# Patient Record
Sex: Female | Born: 1970 | Race: Black or African American | Hispanic: No | Smoking: Current some day smoker
Health system: Southern US, Community
[De-identification: ages and names within clinical notes are randomized; demographics above are authoritative.]

## PROBLEM LIST (undated history)

## (undated) DIAGNOSIS — E785 Hyperlipidemia, unspecified: Secondary | ICD-10-CM

## (undated) DIAGNOSIS — E119 Type 2 diabetes mellitus without complications: Secondary | ICD-10-CM

## (undated) HISTORY — PX: BREAST REDUCTION SURGERY: SHX8

## (undated) HISTORY — DX: Hyperlipidemia, unspecified: E78.5

## (undated) HISTORY — DX: Type 2 diabetes mellitus without complications: E11.9

## (undated) HISTORY — PX: OTHER SURGICAL HISTORY: SHX169

## (undated) HISTORY — PX: REDUCTION MAMMAPLASTY: SUR839

---

## 2018-09-26 ENCOUNTER — Other Ambulatory Visit: Payer: Self-pay | Admitting: Physician Assistant

## 2018-09-26 DIAGNOSIS — Z1231 Encounter for screening mammogram for malignant neoplasm of breast: Secondary | ICD-10-CM

## 2018-09-28 ENCOUNTER — Ambulatory Visit: Payer: Self-pay

## 2018-10-17 DIAGNOSIS — Z1231 Encounter for screening mammogram for malignant neoplasm of breast: Secondary | ICD-10-CM | POA: Diagnosis not present

## 2018-10-17 DIAGNOSIS — I10 Essential (primary) hypertension: Secondary | ICD-10-CM | POA: Diagnosis not present

## 2018-10-17 DIAGNOSIS — N941 Unspecified dyspareunia: Secondary | ICD-10-CM | POA: Diagnosis not present

## 2018-10-17 DIAGNOSIS — Z0000 Encounter for general adult medical examination without abnormal findings: Secondary | ICD-10-CM | POA: Diagnosis not present

## 2018-10-17 DIAGNOSIS — Z124 Encounter for screening for malignant neoplasm of cervix: Secondary | ICD-10-CM | POA: Diagnosis not present

## 2018-10-17 DIAGNOSIS — Z01419 Encounter for gynecological examination (general) (routine) without abnormal findings: Secondary | ICD-10-CM | POA: Diagnosis not present

## 2018-10-17 DIAGNOSIS — E236 Other disorders of pituitary gland: Secondary | ICD-10-CM | POA: Diagnosis not present

## 2018-10-17 DIAGNOSIS — Z113 Encounter for screening for infections with a predominantly sexual mode of transmission: Secondary | ICD-10-CM | POA: Diagnosis not present

## 2018-10-17 DIAGNOSIS — J329 Chronic sinusitis, unspecified: Secondary | ICD-10-CM | POA: Diagnosis not present

## 2018-10-17 DIAGNOSIS — Z23 Encounter for immunization: Secondary | ICD-10-CM | POA: Diagnosis not present

## 2018-10-17 DIAGNOSIS — R350 Frequency of micturition: Secondary | ICD-10-CM | POA: Diagnosis not present

## 2018-10-17 LAB — HM HIV SCREENING LAB: HM HIV Screening: NEGATIVE

## 2018-10-20 LAB — HM PAP SMEAR: HM Pap smear: NEGATIVE

## 2018-10-21 ENCOUNTER — Ambulatory Visit: Payer: Self-pay

## 2018-10-28 ENCOUNTER — Inpatient Hospital Stay: Admission: RE | Admit: 2018-10-28 | Payer: Self-pay | Source: Ambulatory Visit

## 2018-11-07 DIAGNOSIS — Z6829 Body mass index (BMI) 29.0-29.9, adult: Secondary | ICD-10-CM | POA: Diagnosis not present

## 2018-11-07 DIAGNOSIS — E119 Type 2 diabetes mellitus without complications: Secondary | ICD-10-CM | POA: Diagnosis not present

## 2018-11-14 DIAGNOSIS — Z713 Dietary counseling and surveillance: Secondary | ICD-10-CM | POA: Diagnosis not present

## 2018-11-22 DIAGNOSIS — E119 Type 2 diabetes mellitus without complications: Secondary | ICD-10-CM | POA: Diagnosis not present

## 2018-11-22 DIAGNOSIS — R52 Pain, unspecified: Secondary | ICD-10-CM | POA: Diagnosis not present

## 2018-11-22 DIAGNOSIS — J069 Acute upper respiratory infection, unspecified: Secondary | ICD-10-CM | POA: Diagnosis not present

## 2018-11-25 DIAGNOSIS — L602 Onychogryphosis: Secondary | ICD-10-CM | POA: Diagnosis not present

## 2018-11-25 DIAGNOSIS — M2041 Other hammer toe(s) (acquired), right foot: Secondary | ICD-10-CM | POA: Diagnosis not present

## 2018-11-25 DIAGNOSIS — B351 Tinea unguium: Secondary | ICD-10-CM | POA: Diagnosis not present

## 2018-11-25 DIAGNOSIS — M2042 Other hammer toe(s) (acquired), left foot: Secondary | ICD-10-CM | POA: Diagnosis not present

## 2018-12-12 DIAGNOSIS — Z713 Dietary counseling and surveillance: Secondary | ICD-10-CM | POA: Diagnosis not present

## 2019-01-27 DIAGNOSIS — K5904 Chronic idiopathic constipation: Secondary | ICD-10-CM | POA: Diagnosis not present

## 2019-01-27 DIAGNOSIS — R1013 Epigastric pain: Secondary | ICD-10-CM | POA: Diagnosis not present

## 2019-01-27 DIAGNOSIS — K8020 Calculus of gallbladder without cholecystitis without obstruction: Secondary | ICD-10-CM | POA: Diagnosis not present

## 2019-01-27 DIAGNOSIS — R140 Abdominal distension (gaseous): Secondary | ICD-10-CM | POA: Diagnosis not present

## 2019-02-10 DIAGNOSIS — E119 Type 2 diabetes mellitus without complications: Secondary | ICD-10-CM | POA: Diagnosis not present

## 2019-02-10 DIAGNOSIS — K8020 Calculus of gallbladder without cholecystitis without obstruction: Secondary | ICD-10-CM | POA: Diagnosis not present

## 2019-02-10 DIAGNOSIS — R102 Pelvic and perineal pain: Secondary | ICD-10-CM | POA: Diagnosis not present

## 2019-02-10 DIAGNOSIS — F411 Generalized anxiety disorder: Secondary | ICD-10-CM | POA: Diagnosis not present

## 2019-02-10 DIAGNOSIS — R109 Unspecified abdominal pain: Secondary | ICD-10-CM | POA: Diagnosis not present

## 2019-02-13 DIAGNOSIS — R109 Unspecified abdominal pain: Secondary | ICD-10-CM | POA: Diagnosis not present

## 2019-02-13 DIAGNOSIS — Z713 Dietary counseling and surveillance: Secondary | ICD-10-CM | POA: Diagnosis not present

## 2019-02-13 DIAGNOSIS — E119 Type 2 diabetes mellitus without complications: Secondary | ICD-10-CM | POA: Diagnosis not present

## 2019-02-13 LAB — LIPID PANEL
Cholesterol: 178 (ref 0–200)
HDL: 54 (ref 35–70)
LDL Cholesterol: 111
Triglycerides: 67 (ref 40–160)

## 2019-04-19 DIAGNOSIS — M545 Low back pain: Secondary | ICD-10-CM | POA: Diagnosis not present

## 2019-04-28 DIAGNOSIS — F411 Generalized anxiety disorder: Secondary | ICD-10-CM | POA: Diagnosis not present

## 2019-05-05 DIAGNOSIS — F411 Generalized anxiety disorder: Secondary | ICD-10-CM | POA: Diagnosis not present

## 2019-05-19 DIAGNOSIS — F411 Generalized anxiety disorder: Secondary | ICD-10-CM | POA: Diagnosis not present

## 2019-06-02 DIAGNOSIS — F411 Generalized anxiety disorder: Secondary | ICD-10-CM | POA: Diagnosis not present

## 2019-06-12 DIAGNOSIS — E119 Type 2 diabetes mellitus without complications: Secondary | ICD-10-CM | POA: Diagnosis not present

## 2019-06-12 DIAGNOSIS — K8020 Calculus of gallbladder without cholecystitis without obstruction: Secondary | ICD-10-CM | POA: Diagnosis not present

## 2019-06-12 DIAGNOSIS — Z23 Encounter for immunization: Secondary | ICD-10-CM | POA: Diagnosis not present

## 2019-06-12 DIAGNOSIS — Z6827 Body mass index (BMI) 27.0-27.9, adult: Secondary | ICD-10-CM | POA: Diagnosis not present

## 2019-06-12 DIAGNOSIS — R002 Palpitations: Secondary | ICD-10-CM | POA: Diagnosis not present

## 2019-06-12 LAB — HEMOGLOBIN A1C: Hemoglobin A1C: 7.4

## 2019-06-27 DIAGNOSIS — R634 Abnormal weight loss: Secondary | ICD-10-CM | POA: Diagnosis not present

## 2019-06-27 DIAGNOSIS — R1033 Periumbilical pain: Secondary | ICD-10-CM | POA: Diagnosis not present

## 2019-06-27 DIAGNOSIS — K5900 Constipation, unspecified: Secondary | ICD-10-CM | POA: Diagnosis not present

## 2019-06-27 DIAGNOSIS — R1010 Upper abdominal pain, unspecified: Secondary | ICD-10-CM | POA: Diagnosis not present

## 2019-06-27 LAB — BASIC METABOLIC PANEL
BUN: 11 (ref 4–21)
Creatinine: 0.7 (ref 0.5–1.1)
Glucose: 163
Potassium: 4.1 (ref 3.4–5.3)
Sodium: 142 (ref 137–147)

## 2019-07-07 DIAGNOSIS — R1033 Periumbilical pain: Secondary | ICD-10-CM | POA: Diagnosis not present

## 2019-07-07 DIAGNOSIS — R109 Unspecified abdominal pain: Secondary | ICD-10-CM | POA: Diagnosis not present

## 2019-07-07 DIAGNOSIS — K5730 Diverticulosis of large intestine without perforation or abscess without bleeding: Secondary | ICD-10-CM | POA: Diagnosis not present

## 2019-07-07 DIAGNOSIS — R634 Abnormal weight loss: Secondary | ICD-10-CM | POA: Diagnosis not present

## 2019-07-07 DIAGNOSIS — R1010 Upper abdominal pain, unspecified: Secondary | ICD-10-CM | POA: Diagnosis not present

## 2019-07-11 DIAGNOSIS — K3189 Other diseases of stomach and duodenum: Secondary | ICD-10-CM | POA: Diagnosis not present

## 2019-07-11 DIAGNOSIS — R634 Abnormal weight loss: Secondary | ICD-10-CM | POA: Diagnosis not present

## 2019-07-11 DIAGNOSIS — R1013 Epigastric pain: Secondary | ICD-10-CM | POA: Diagnosis not present

## 2019-07-11 DIAGNOSIS — R933 Abnormal findings on diagnostic imaging of other parts of digestive tract: Secondary | ICD-10-CM | POA: Diagnosis not present

## 2019-07-19 DIAGNOSIS — K8020 Calculus of gallbladder without cholecystitis without obstruction: Secondary | ICD-10-CM | POA: Diagnosis not present

## 2019-07-19 DIAGNOSIS — K760 Fatty (change of) liver, not elsewhere classified: Secondary | ICD-10-CM | POA: Diagnosis not present

## 2019-07-19 DIAGNOSIS — R1013 Epigastric pain: Secondary | ICD-10-CM | POA: Diagnosis not present

## 2019-07-25 DIAGNOSIS — Z20828 Contact with and (suspected) exposure to other viral communicable diseases: Secondary | ICD-10-CM | POA: Diagnosis not present

## 2019-08-10 DIAGNOSIS — R1013 Epigastric pain: Secondary | ICD-10-CM | POA: Diagnosis not present

## 2019-08-30 DIAGNOSIS — R1013 Epigastric pain: Secondary | ICD-10-CM | POA: Diagnosis not present

## 2019-09-05 ENCOUNTER — Other Ambulatory Visit: Payer: Self-pay

## 2019-09-05 ENCOUNTER — Encounter: Payer: BC Managed Care – PPO | Attending: Physician Assistant | Admitting: *Deleted

## 2019-09-05 DIAGNOSIS — E119 Type 2 diabetes mellitus without complications: Secondary | ICD-10-CM | POA: Insufficient documentation

## 2019-09-05 NOTE — Patient Instructions (Signed)
Plan:  Aim for 3 Carb Choices per meal (45 grams) +/- 1 either way  Aim for 0-1 Carbs per snack if hungry  Include protein in moderation with your meals and snacks Consider reading food labels for Total Carbohydrate of foods Consider  increasing your activity level by dancing, walking or chair exercises for 15-30 minutes daily as tolerated Continue checking BG at alternate times per day  Continue taking medication as directed by MD

## 2019-09-06 NOTE — Progress Notes (Signed)
Diabetes Self-Management Education  Visit Type: First/Initial  Appt. Start Time: 1530 Appt. End Time: 1700  09/06/2019  [name], identified by name and date of birth, is a 48 y.o. female with a diagnosis of Diabetes: Type 2. Patient is newly diagnosed in February this year. She works as a Geophysicist/field seismologist full time. She is concerned about changes in her vision and is interested in learning more about nutrition guidelines for diabetes control today. She is already testing her blood sugars daily and is walking her dog when the weather permits.   ASSESSMENT  There were no vitals taken for this visit. There is no height or weight on file to calculate BMI.  Diabetes Self-Management Education - 09/05/19 1553      Visit Information   Visit Type  First/Initial      Initial Visit   Diabetes Type  Type 2    Are you currently following a meal plan?  No    Are you taking your medications as prescribed?  Yes    Date Diagnosed  10/2018      Health Coping   How would you rate your overall health?  Good      Psychosocial Assessment   Patient Belief/Attitude about Diabetes  Afraid   afraid of all the chances for complications   Self-care barriers  None    Other persons present  Patient    Patient Concerns  Nutrition/Meal planning;Glycemic Control    Special Needs  None    Preferred Learning Style  Auditory;Visual;Hands on    [place]ira in progress    How often do you need to have someone help you when you read instructions, pamphlets, or other written materials from your doctor or pharmacy?  1 - Never    What is the last grade level you completed in school?  some college      Pre-Education Assessment   Patient understands the diabetes disease and treatment process.  Needs Instruction    Patient understands incorporating nutritional management into lifestyle.  Needs Instruction    Patient undertands incorporating physical activity into lifestyle.  Needs Instruction    Patient understands using medications safely.  Needs Instruction    Patient understands monitoring blood glucose, interpreting and using results  Needs Instruction    Patient understands prevention, detection, and treatment of acute complications.  Needs Instruction    Patient understands prevention, detection, and treatment of chronic complications.  Needs Instruction    Patient understands how to develop strategies to address psychosocial issues.  Needs Instruction    Patient understands how to develop strategies to promote health/change behavior.  Needs Instruction      Complications   Last HgB A1C per patient/outside source  7.4 %    How often do you check your blood sugar?  1-2 times/day    Fasting Blood glucose range (mg/dL)  130-179;70-129    Number of hypoglycemic episodes per month  0    Have you had a dilated eye exam in the past 12 months?  Yes    Have you had a dental exam in the past 12 months?  Yes    Are you checking your feet?  Yes    How many days per week are you checking your feet?  7      Dietary Intake   Breakfast  oatmeal witn cinnamon, butter, cream at home and 2 sausage patties OR McMuffin with sausage OR cajun filet without the biscuit with fries or rounds  Snack (morning)  fruit cup OR granola bar OR fresh fruit with PNB, Belvita crackers with PNB    Lunch  @ 2PM: salmon and collard greens OR lean meat sub 6" occasionally with chips or fries or soup    Snack (afternoon)  nuts OR Popcorn  OR  similar to AM snacks    Dinner  ligher than lunch: salad OR steamed vegetables with meat    Snack (evening)  typically fresh fruit OR triscuits with cheese and olives    Beverage(s)  decaf, water, occasionally 1/2 and 1/2 tea, wine maybe 2-3 nights a week      Exercise   Exercise Type  Light (walking / raking leaves)    How many days per week to you exercise?  1    How many minutes per day do you exercise?  30    Total minutes per week of exercise  30      Patient  Education   Previous Diabetes Education  No    Disease state   Definition of diabetes, type 1 and 2, and the diagnosis of diabetes;Factors that contribute to the development of diabetes    Nutrition management   Role of diet in the treatment of diabetes and the relationship between the three main macronutrients and blood glucose level;Food label reading, portion sizes and measuring food.;Carbohydrate counting;Reviewed blood glucose goals for pre and post meals and how to evaluate the patients' food intake on their blood glucose level.    Physical activity and exercise   Role of exercise on diabetes management, blood pressure control and cardiac health.;Helped patient identify appropriate exercises in relation to his/her diabetes, diabetes complications and other health issue.    Medications  Reviewed patients medication for diabetes, action, purpose, timing of dose and side effects.    Monitoring  Identified appropriate SMBG and/or A1C goals.;Purpose and frequency of SMBG.    Chronic complications  Relationship between chronic complications and blood glucose control    Psychosocial adjustment  Role of stress on diabetes      Individualized Goals (developed by patient)   Nutrition  Follow meal plan discussed    Physical Activity  Exercise 3-5 times per week    Medications  take my medication as prescribed    Monitoring   test blood glucose pre and post meals as discussed      Post-Education Assessment   Patient understands the diabetes disease and treatment process.  Demonstrates understanding / competency    Patient understands incorporating nutritional management into lifestyle.  Demonstrates understanding / competency    Patient undertands incorporating physical activity into lifestyle.  Demonstrates understanding / competency    Patient understands using medications safely.  Demonstrates understanding / competency    Patient understands monitoring blood glucose, interpreting and using results   Demonstrates understanding / competency    Patient understands prevention, detection, and treatment of acute complications.  Demonstrates understanding / competency    Patient understands prevention, detection, and treatment of chronic complications.  Demonstrates understanding / competency    Patient understands how to develop strategies to address psychosocial issues.  Demonstrates understanding / competency    Patient understands how to develop strategies to promote health/change behavior.  Demonstrates understanding / competency      Outcomes   Expected Outcomes  Demonstrated interest in learning. Expect positive outcomes    Future DMSE  4-6 wks    Program Status  Not Completed       Individualized Plan for Diabetes Self-Management Training:  Learning Objective:  Patient will have a greater understanding of diabetes self-management. Patient education plan is to attend individual and/or group sessions per assessed needs and concerns.   Plan:   Patient Instructions  Plan:  Aim for 3 Carb Choices per meal (45 grams) +/- 1 either way  Aim for 0-1 Carbs per snack if hungry  Include protein in moderation with your meals and snacks Consider reading food labels for Total Carbohydrate of foods Consider  increasing your activity level by dancing, walking or chair exercises for 15-30 minutes daily as tolerated Continue checking BG at alternate times per day  Continue taking medication as directed by MD  Expected Outcomes:  Demonstrated interest in learning. Expect positive outcomes  Education material provided: Food label handouts, A1C conversion sheet, Meal plan card and Carbohydrate counting sheet  If problems or questions, patient to contact team via:  Phone  Future DSME appointment: 4-6 wks

## 2019-09-11 DIAGNOSIS — K8050 Calculus of bile duct without cholangitis or cholecystitis without obstruction: Secondary | ICD-10-CM | POA: Diagnosis not present

## 2019-10-02 ENCOUNTER — Ambulatory Visit: Payer: BC Managed Care – PPO | Admitting: Dietician

## 2019-10-05 ENCOUNTER — Other Ambulatory Visit: Payer: Self-pay

## 2019-10-05 ENCOUNTER — Encounter: Payer: Self-pay | Admitting: Family Medicine

## 2019-10-05 ENCOUNTER — Ambulatory Visit: Payer: BC Managed Care – PPO | Admitting: Family Medicine

## 2019-10-05 VITALS — BP 122/89 | HR 71 | Temp 97.7°F | Ht 66.25 in | Wt 168.0 lb

## 2019-10-05 DIAGNOSIS — E119 Type 2 diabetes mellitus without complications: Secondary | ICD-10-CM

## 2019-10-05 DIAGNOSIS — R413 Other amnesia: Secondary | ICD-10-CM

## 2019-10-05 DIAGNOSIS — Z20822 Contact with and (suspected) exposure to covid-19: Secondary | ICD-10-CM | POA: Diagnosis not present

## 2019-10-05 NOTE — Progress Notes (Signed)
Subjective:     Brianna Stephens is a 49 y.o. 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 Packs/day: 0.25  . Years: 22.00  . Pack years: 5.50  . Types: Cigarettes  Smokeless Tobacco Never Used  Tobacco Comment   not daily        Objective:    BP Readings from Last 3 Encounters:  10/05/19 122/89   Wt Readings from Last 3 Encounters:  10/05/19 168 lb (76.2 kg)    BP 122/89   Pulse 71   Temp 97.7 F (36.5 C)   Ht 5' 6.25" (1.683 m)   Wt 168 lb (76.2 kg)   SpO2 98%   BMI 26.91 kg/m    Physical Exam Constitutional:      General: She is not in acute distress.    Appearance: She is well-developed. She is not diaphoretic.  HENT:     Right Ear: External ear normal.     Left Ear: External ear normal.     Nose: Nose normal.  Eyes:     Conjunctiva/sclera: Conjunctivae normal.  Cardiovascular:     Rate and Rhythm: Normal rate.  Pulmonary:     Effort: Pulmonary effort is normal.  Musculoskeletal:     Cervical back: Neck supple.  Skin:    General: Skin is warm and dry.     Capillary Refill: Capillary refill takes less than 2 seconds.  Neurological:     Mental Status: She is alert. Mental status is at baseline.  Psychiatric:      Mood and Affect: Mood normal.        Behavior: Behavior normal.           Assessment & Plan:   Problem List Items Addressed This Visit      Endocrine   Type 2 diabetes mellitus without complication, without long-term current use of insulin ([place])    Working on diet and seeing a nutritionist. Last check was trending up so will repeat today. Cont metformin.       Relevant Medications   atorvastatin (LIPITOR) 20 MG tablet   Other Relevant Orders   Hemoglobin A1c     Other   Poor short term memory - Primary    Normal memory evaluation today. Will get labs to assess. Suspect anxiety vs ADHD given that it seems attention and organization are most greatly impacted. Psychology referral for counseling as well as assessment to aid in diagnosis if ADHD.       Relevant Orders   Comprehensive metabolic panel   Vitamin J88   TSH   Ambulatory referral to Psychology   Exposure to COVID-19 virus    Discussed results of antibody test  and that if positive we do not know how long immunity will last. Pt understands and plans to still get the vaccine.       Relevant Orders   SARS-CoV-2 Antibodies       Return if symptoms worsen or fail to improve.  Lynnda Child, MD

## 2019-10-05 NOTE — Assessment & Plan Note (Signed)
Normal memory evaluation today. Will get labs to assess. Suspect anxiety vs ADHD given that it seems attention and organization are most greatly impacted. Psychology referral for counseling as well as assessment to aid in diagnosis if ADHD.

## 2019-10-05 NOTE — Patient Instructions (Addendum)
[email or web address]  [email or web address]   #Memory issues - psychology referral for evaluation - labs today

## 2019-10-05 NOTE — Assessment & Plan Note (Signed)
Discussed results of antibody test and that if positive we do not know how long immunity will last. Pt understands and plans to still get the vaccine.

## 2019-10-05 NOTE — Assessment & Plan Note (Addendum)
Working on diet and seeing a nutritionist. Last check was trending up so will repeat today. Cont metformin.

## 2019-10-06 ENCOUNTER — Ambulatory Visit: Payer: BC Managed Care – PPO | Admitting: Dietician

## 2019-10-06 LAB — COMPREHENSIVE METABOLIC PANEL
ALT: 14 U/L (ref 0–35)
AST: 13 U/L (ref 0–37)
Albumin: 4.7 g/dL (ref 3.5–5.2)
Alkaline Phosphatase: 51 U/L (ref 39–117)
BUN: 11 mg/dL (ref 6–23)
CO2: 30 mEq/L (ref 19–32)
Calcium: 9.6 mg/dL (ref 8.4–10.5)
Chloride: 103 mEq/L (ref 96–112)
Creatinine, Ser: 0.63 mg/dL (ref 0.40–1.20)
GFR: 100.35 mL/min (ref >60.00)
Glucose, Bld: 146 mg/dL — ABNORMAL HIGH (ref 70–99)
Potassium: 3.9 mEq/L (ref 3.5–5.1)
Sodium: 139 mEq/L (ref 135–145)
Total Bilirubin: 0.4 mg/dL (ref 0.2–1.2)
Total Protein: 7.8 g/dL (ref 6.0–8.3)

## 2019-10-06 LAB — SARS-COV-2 ANTIBODIES: SARS-CoV-2 Antibodies: NEGATIVE

## 2019-10-06 LAB — VITAMIN B12: Vitamin B-12: 282 pg/mL (ref 211–911)

## 2019-10-06 LAB — HEMOGLOBIN A1C: Hgb A1c MFr Bld: 7.1 % — ABNORMAL HIGH (ref 4.6–6.5)

## 2019-10-06 LAB — TSH: TSH: 0.71 u[IU]/mL (ref 0.35–4.50)

## 2019-10-24 ENCOUNTER — Ambulatory Visit: Payer: BC Managed Care – PPO | Admitting: Family Medicine

## 2019-10-24 DIAGNOSIS — Z01419 Encounter for gynecological examination (general) (routine) without abnormal findings: Secondary | ICD-10-CM | POA: Diagnosis not present

## 2019-10-24 DIAGNOSIS — H2513 Age-related nuclear cataract, bilateral: Secondary | ICD-10-CM | POA: Diagnosis not present

## 2019-10-24 DIAGNOSIS — K8020 Calculus of gallbladder without cholecystitis without obstruction: Secondary | ICD-10-CM | POA: Diagnosis not present

## 2019-10-24 DIAGNOSIS — Z0000 Encounter for general adult medical examination without abnormal findings: Secondary | ICD-10-CM | POA: Diagnosis not present

## 2019-10-24 DIAGNOSIS — Z1231 Encounter for screening mammogram for malignant neoplasm of breast: Secondary | ICD-10-CM | POA: Diagnosis not present

## 2019-10-24 DIAGNOSIS — Z124 Encounter for screening for malignant neoplasm of cervix: Secondary | ICD-10-CM | POA: Diagnosis not present

## 2019-10-24 DIAGNOSIS — E119 Type 2 diabetes mellitus without complications: Secondary | ICD-10-CM | POA: Diagnosis not present

## 2019-10-24 DIAGNOSIS — N760 Acute vaginitis: Secondary | ICD-10-CM | POA: Diagnosis not present

## 2019-10-24 DIAGNOSIS — B9689 Other specified bacterial agents as the cause of diseases classified elsewhere: Secondary | ICD-10-CM | POA: Diagnosis not present

## 2019-10-24 DIAGNOSIS — E236 Other disorders of pituitary gland: Secondary | ICD-10-CM | POA: Diagnosis not present

## 2019-10-27 ENCOUNTER — Ambulatory Visit: Payer: Self-pay | Admitting: Dietician

## 2019-11-13 DIAGNOSIS — L659 Nonscarring hair loss, unspecified: Secondary | ICD-10-CM | POA: Diagnosis not present

## 2019-11-20 DIAGNOSIS — F900 Attention-deficit hyperactivity disorder, predominantly inattentive type: Secondary | ICD-10-CM | POA: Diagnosis not present

## 2019-11-20 DIAGNOSIS — F411 Generalized anxiety disorder: Secondary | ICD-10-CM | POA: Diagnosis not present

## 2019-11-27 DIAGNOSIS — R430 Anosmia: Secondary | ICD-10-CM | POA: Diagnosis not present

## 2019-11-27 DIAGNOSIS — Z20828 Contact with and (suspected) exposure to other viral communicable diseases: Secondary | ICD-10-CM | POA: Diagnosis not present

## 2019-11-27 DIAGNOSIS — R197 Diarrhea, unspecified: Secondary | ICD-10-CM | POA: Diagnosis not present

## 2019-11-27 DIAGNOSIS — R432 Parageusia: Secondary | ICD-10-CM | POA: Diagnosis not present

## 2019-11-27 DIAGNOSIS — R5383 Other fatigue: Secondary | ICD-10-CM | POA: Diagnosis not present

## 2019-12-11 DIAGNOSIS — F900 Attention-deficit hyperactivity disorder, predominantly inattentive type: Secondary | ICD-10-CM | POA: Diagnosis not present

## 2019-12-11 DIAGNOSIS — F411 Generalized anxiety disorder: Secondary | ICD-10-CM | POA: Diagnosis not present

## 2020-02-04 ENCOUNTER — Emergency Department (HOSPITAL_COMMUNITY): Payer: BC Managed Care – PPO

## 2020-02-04 ENCOUNTER — Emergency Department (HOSPITAL_COMMUNITY)
Admission: EM | Admit: 2020-02-04 | Discharge: 2020-02-04 | Disposition: A | Discharge status: Home / Self Care | Payer: BC Managed Care – PPO | Attending: Emergency Medicine | Admitting: Emergency Medicine

## 2020-02-04 ENCOUNTER — Encounter (HOSPITAL_COMMUNITY): Payer: Self-pay | Admitting: Emergency Medicine

## 2020-02-04 ENCOUNTER — Other Ambulatory Visit: Payer: Self-pay

## 2020-02-04 DIAGNOSIS — Z7984 Long term (current) use of oral hypoglycemic drugs: Secondary | ICD-10-CM | POA: Diagnosis not present

## 2020-02-04 DIAGNOSIS — Z79899 Other long term (current) drug therapy: Secondary | ICD-10-CM | POA: Insufficient documentation

## 2020-02-04 DIAGNOSIS — R079 Chest pain, unspecified: Secondary | ICD-10-CM | POA: Diagnosis not present

## 2020-02-04 DIAGNOSIS — E119 Type 2 diabetes mellitus without complications: Secondary | ICD-10-CM | POA: Diagnosis not present

## 2020-02-04 DIAGNOSIS — R1010 Upper abdominal pain, unspecified: Secondary | ICD-10-CM | POA: Insufficient documentation

## 2020-02-04 DIAGNOSIS — R0789 Other chest pain: Secondary | ICD-10-CM | POA: Diagnosis not present

## 2020-02-04 DIAGNOSIS — F17210 Nicotine dependence, cigarettes, uncomplicated: Secondary | ICD-10-CM | POA: Diagnosis not present

## 2020-02-04 LAB — BASIC METABOLIC PANEL
Anion gap: 11 (ref 5–15)
BUN: 10 mg/dL (ref 6–20)
CO2: 27 mmol/L (ref 22–32)
Calcium: 9.8 mg/dL (ref 8.9–10.3)
Chloride: 103 mmol/L (ref 98–111)
Creatinine, Ser: 0.6 mg/dL (ref 0.44–1.00)
GFR calc Af Amer: >60 mL/min (ref >60)
GFR calc non Af Amer: >60 mL/min (ref >60)
Glucose, Bld: 235 mg/dL — ABNORMAL HIGH (ref 70–99)
Potassium: 3.9 mmol/L (ref 3.5–5.1)
Sodium: 141 mmol/L (ref 135–145)

## 2020-02-04 LAB — CBC
HCT: 38.4 % (ref 36.0–46.0)
Hemoglobin: 12.8 g/dL (ref 12.0–15.0)
MCH: 26.7 pg (ref 26.0–34.0)
MCHC: 33.3 g/dL (ref 30.0–36.0)
MCV: 80.2 fL (ref 80.0–100.0)
Platelets: 155 10*3/uL (ref 150–400)
RBC: 4.79 MIL/uL (ref 3.87–5.11)
RDW: 14.4 % (ref 11.5–15.5)
WBC: 4.3 10*3/uL (ref 4.0–10.5)
nRBC: 0 % (ref 0.0–0.2)

## 2020-02-04 LAB — TROPONIN I (HIGH SENSITIVITY): Troponin I (High Sensitivity): <2 ng/L (ref <18)

## 2020-02-04 LAB — POC URINE PREG, ED: Preg Test, Ur: NEGATIVE

## 2020-02-04 LAB — I-STAT BETA HCG BLOOD, ED (MC, WL, AP ONLY): I-stat hCG, quantitative: 20.8 m[IU]/mL — ABNORMAL HIGH (ref <5)

## 2020-02-04 MED ORDER — SODIUM CHLORIDE 0.9% FLUSH: 3.0000 mL | Freq: Once | INTRAVENOUS | Status: DC

## 2020-02-04 NOTE — Discharge Instructions (Addendum)
You were evaluated in the emergency department for chest pain.    Based on your risk factors, work up and exam you are considered low risk for major adverse cardiac events in the next 30 days.  This means you can be discharged with close follow up with follow up with primary care doctor further outpatient work up as needed, if your chest discomfort continues you doctor may need to refer you to cardiology  Call your primary care doctor further discussion and work up of your symptoms on an outpatient setting  Please return to ED if: Your chest pain is worse or on exertion You have a cough that gets worse, or you cough up blood. You have severe pain in chest, back or abdomen. You have chest pain with loss of sensation, weakness or tingling in extremities You have chest pain or shortness of breath with exertion or activity You have sudden, unexplained discomfort in your chest, with radiation arms, back, neck, or jaw. You suddenly have chest pain and begin to sweat, or your skin gets clammy. You feel chest pain with nausea or vomiting, blood in vomit You suddenly feel light-headed or faint. Your heart begins to beat quickly, or it feels like it is skipping beats. You have one sided leg swelling or calf pain You have chest pain with fever, chills, cough or other viral symptoms  The cause of your chest pain is unclear, it may be related to gallstones.  It may be related to gastritis or muscular cause.  Take omeprazole 40 mg on an empty stomach every morning.

## 2020-02-04 NOTE — ED Triage Notes (Signed)
C/o intermittent aching to L chest x 2-3 months.  Reports intermittent L hand tingling and diaphoresis at night x 2 weeks.  Denies SOB.  Reports nausea but thinks it may be related to gallbladder issues she has.

## 2020-02-04 NOTE — ED Provider Notes (Addendum)
[facility] EMERGENCY DEPARTMENT Provider Note   CSN: [id number] Arrival date & time: 02/04/20  1259     History Chief Complaint  Patient presents with  . Chest Pain    Brianna Stephens is a 49 y.o. female with history of diabetes on metformin, HLD, gallstones presents to ER for evaluation of chest pain.  Reports chest pain has been going on for 2-3 months.  Has been told it was from gallstones, has been seen by general surgery who recommended elective cholecystectomy.  States her usual gall bladder pain causes chest and upper abdominal discomfort but she only has chest discomfort now and no abdominal pain. The pain is located under the left breast.  It feels like an ache.  No changes with exertion, position, movement or eating or breathing.  States the pain feels the same as usual but now she has new symptoms for the last one week.  Reports associated clamminess, sweats at night.  Today at work she felt like her brain was foggy like all of a sudden she didn't know what she was doing.  Also states she feels like she has been tripping more than usual. Also noticed intermittent left hand achiness and tingling.  She just started taking collagen supplements 2 days ago  Because she says she wants to intake more protein for her muscles.  Completed COVID vaccine in April. Denies recent post prandrial chest or abdominal pain, gas, burping, regurgitation.  Denies fever, nausea, vomiting. Denies cough, SOB.  No recent lifting or exercise to cause muscular pain.   HPI     Past Medical History:  Diagnosis Date  . Diabetes mellitus without complication (HCC)   . Hyperlipidemia     Patient Active Problem List   Diagnosis Date Noted  . Poor short term memory 10/05/2019  . Type 2 diabetes mellitus without complication, without long-term current use of insulin ([facility]) 10/05/2019  . Exposure to COVID-19 virus 10/05/2019    Past Surgical History:  Procedure Laterality Date  . bone  spur surgery     both pinky toes  . BREAST REDUCTION SURGERY Bilateral   . CESAREAN SECTION     x 2     OB History   No obstetric history on file.     Family History  Problem Relation Age of Onset  . Diabetes Mother   . Hypertension Mother   . Hypertension Father   . Asthma Sister   . Diabetes Paternal Grandmother   . Atrial fibrillation Paternal Grandmother     Social History   Tobacco Use  . Smoking status: Current Some Day Smoker    Packs/day: 0.25    Years: 22.00    Pack years: 5.50    Types: Cigarettes  . Smokeless tobacco: Never Used  . Tobacco comment: not daily  Substance Use Topics  . Alcohol use: Yes    Comment: wine- 2 days a week  . Drug use: Never    Home Medications Prior to Admission medications   Medication Sig Start Date End Date Taking? Authorizing Provider  Accu-Chek FastClix Lancets MISC USE BID AS DIRECTED 11/07/18   [provider]  ALPRAZolam Prudy Feeler) 0.5 MG tablet Take 1 tablet by mouth 2 (two) times daily as needed. 10/19/18   [provider]  atorvastatin (LIPITOR) 20 MG tablet Take 20 mg by mouth daily. 09/03/19   [provider]  Blood Glucose Monitoring Suppl (GLUCOCOM BLOOD GLUCOSE MONITOR) DEVI by Does not apply route. 11/07/18 11/07/20  [provider]  fluticasone (FLONASE) 50 MCG/ACT nasal spray 1 spray by Each Nare route daily. 06/12/19 06/11/20  [provider]  glucose blood (ACCU-CHEK GUIDE) test strip TEST TWICE DAILY AS DIRECTED 06/19/19   [provider]  LINZESS 145 MCG CAPS capsule Take 145 mcg by mouth daily as needed. 06/05/19   [provider]  metFORMIN (GLUCOPHAGE) 500 MG tablet Take by mouth 2 (two) times daily with a meal.    [provider]  Multiple Vitamin (MULTIVITAMIN) tablet Take by mouth.    [provider]    Allergies    Patient has no known allergies.  Review of Systems   Review of Systems  Constitutional:       Night sweats     Cardiovascular: Positive for chest pain.  Neurological:       Hand paresthesias   All other systems reviewed and are negative.   Physical Exam Updated Vital Signs BP (!) 143/99 (BP Location: Left Arm)   Pulse 73   Temp 98.1 F (36.7 C) (Oral)   Resp 16   SpO2 100%   Physical Exam Constitutional:      Appearance: She is well-developed.     Comments: NAD. Non toxic.   HENT:     Head: Normocephalic and atraumatic.     Nose: Nose normal.  Eyes:     General: Lids are normal.     Conjunctiva/sclera: Conjunctivae normal.  Neck:     Trachea: Trachea normal.     Comments: Trachea midline.  Cardiovascular:     Rate and Rhythm: Normal rate and regular rhythm.     Pulses:          Radial pulses are 1+ on the right side and 1+ on the left side.       Dorsalis pedis pulses are 1+ on the right side and 1+ on the left side.     Heart sounds: Normal heart sounds, S1 normal and S2 normal.     Comments: No murmurs. No LE edema or calf tenderness.  Pulmonary:     Effort: Pulmonary effort is normal.     Breath sounds: Normal breath sounds.  Chest:     Comments: No reproducible chest wall tenderness. No reproducible pain with active movement of LUE Abdominal:     General: Bowel sounds are normal.     Palpations: Abdomen is soft.     Tenderness: There is no abdominal tenderness.     Comments: No epigastric or upper abdominal tenderness.  Musculoskeletal:     Cervical back: Normal range of motion.  Skin:    General: Skin is warm and dry.     Capillary Refill: Capillary refill takes less than 2 seconds.     Comments: No rash to chest wall  Neurological:     Mental Status: She is alert.     GCS: GCS eye subscore is 4. GCS verbal subscore is 5. GCS motor subscore is 6.     Comments: Sensation and strength intact in upper/lower extremities  Psychiatric:        Speech: Speech normal.        Behavior: Behavior normal.        Thought Content: Thought content normal.     ED Results /  Procedures / Treatments   Labs (all labs ordered are listed, but only abnormal results are displayed) Labs Reviewed  BASIC METABOLIC PANEL - Abnormal; Notable for the following components:      Result Value  Glucose, Bld 235 (*)    All other components within normal limits  I-STAT BETA HCG BLOOD, ED ([facility], [facility], [facility] ONLY) - Abnormal; Notable for the following components:   I-stat hCG, quantitative 20.8 (*)    All other components within normal limits  CBC  POC URINE PREG, ED  TROPONIN I (HIGH SENSITIVITY)    EKG EKG Interpretation  Date/Time:  Sunday Feb 04 2020 13:00:20 EDT Ventricular Rate:  74 PR Interval:  186 QRS Duration: 78 QT Interval:  384 QTC Calculation: 426 R Axis:   48 Text Interpretation: Normal sinus rhythm Low voltage QRS Nonspecific T wave abnormality Confirmed by Cathren Laine ([id number]) on 02/04/2020 2:14:57 PM   Radiology DG Chest 2 View  Result Date: 02/04/2020 CLINICAL DATA:  Left chest pain for 2-3 months. Night sweats for 2 weeks. Nausea. EXAM: CHEST - 2 VIEW COMPARISON:  None. FINDINGS: The heart size and mediastinal contours are within normal limits. Both lungs are clear. The visualized skeletal structures are unremarkable. IMPRESSION: No active cardiopulmonary disease. Electronically Signed   By: Danae Orleans M.D.   On: 02/04/2020 13:31    Procedures Procedures (including critical care time)  Medications Ordered in ED Medications  sodium chloride flush (NS) 0.9 % injection 3 mL (0 mLs Intravenous Hold 02/04/20 1504)    ED Course  I have reviewed the triage vital signs and the nursing notes.  Pertinent labs & imaging results that were available during my care of the patient were reviewed by me and considered in my medical decision making (see chart for details).  Clinical Course as of Feb 04 1536  Sun Feb 04, 2020  1521 Preg Test, Ur: NEGATIVE [CG]  1521 I-stat hCG, quantitative(!): 20.8 [CG]  1521 Troponin I (High Sensitivity): <2 [CG]      Clinical Course User Index [CG] Liberty Handy, PA-C   MDM Rules/Calculators/A&P                      Pt is a 49 y.o. female presents with what sounds like atypical CP. Onset 2-3 months ago.  Told in the past it was related to gallstones, planning on elective cholecystectomy.  CP has been constant since arrival.  CP is non exertional, non pleuritic, non positional. No associated concerning features such as fever, cough, SOB. No recent illnesses. No palpitations, light-headedness, syncope, pleuritic pain, leg swelling/calf pain to suggest DVT.  Cardiac risk factors include HLD, DM, tobacco use only.  Traveled to [place] recently but flight was only 3 hours.   VS WNL and stable. CV and pulmonary exam benign. There is no reproducible CP or abdominal pain with palpation and position changes.  No LE edema or calf tenderness. No neuro or pulse deficits.   ER work up initiated in triage including CBC, BMP, EKG, CXR and trop x 1.  These were personally reviewed and interpreted.  Work up benign. EKG non-ischemic.  Hs-trop undetectable.  CP has been constant and no need for repeat troponin. No risk factors for PE/DVT.  PERC negative. HEAR score < 3. No LFT or lipase but patient has no abdominal pain, doubt GI surgical cause.  Given symptomatology, exam, non ischemic cardiac work up in ER and HEART score patient is appropriate for discharge with PCP f/u.  Work up not suggestive of symptomatic anemia, PE, PTX, dissection, ACS.  Likely atypical chest pain, possibly MSK etiology vs pleurisy vs costochondritis vs GI related vs other. Recommended tylenol and PPI every morning. ED return  preacutions given. Pt appears reliable for follow up, aware of symptoms that would warrant return to ER.  Pt is comfortable and agreeable with ER POC and discharge plan.   Final Clinical Impression(s) / ED Diagnoses Final diagnoses:  Atypical chest pain    Rx / DC Orders ED Discharge Orders    None         Kinnie Feil, PA-C 02/04/20 1538    Lajean Saver, MD 02/04/20 [phone]

## 2020-02-19 DIAGNOSIS — F411 Generalized anxiety disorder: Secondary | ICD-10-CM | POA: Diagnosis not present

## 2020-02-19 DIAGNOSIS — L659 Nonscarring hair loss, unspecified: Secondary | ICD-10-CM | POA: Diagnosis not present

## 2020-02-19 DIAGNOSIS — R413 Other amnesia: Secondary | ICD-10-CM | POA: Diagnosis not present

## 2020-02-19 DIAGNOSIS — E119 Type 2 diabetes mellitus without complications: Secondary | ICD-10-CM | POA: Diagnosis not present

## 2020-02-27 ENCOUNTER — Other Ambulatory Visit: Payer: Self-pay

## 2020-02-27 ENCOUNTER — Ambulatory Visit: Payer: BC Managed Care – PPO | Admitting: Family Medicine

## 2020-02-27 ENCOUNTER — Encounter: Payer: Self-pay | Admitting: Family Medicine

## 2020-02-27 VITALS — BP 118/80 | HR 76 | Temp 96.6°F | Ht 66.25 in | Wt 160.0 lb

## 2020-02-27 DIAGNOSIS — L659 Nonscarring hair loss, unspecified: Secondary | ICD-10-CM | POA: Diagnosis not present

## 2020-02-27 DIAGNOSIS — E119 Type 2 diabetes mellitus without complications: Secondary | ICD-10-CM | POA: Diagnosis not present

## 2020-02-27 NOTE — Patient Instructions (Addendum)
#  Referral I have placed a referral to a specialist for you. You should receive a phone call from the specialty office. Make sure your voicemail is not full and that if you are able to answer your phone to unknown or new numbers.   It may take up to 2 weeks to hear about the referral. If you do not hear anything in 2 weeks, please call our office and ask to speak with the referral coordinator.     Return in 3 months for diabetes   Increase metformin Week 1: Take 2 tablets in the morning and 1 tablet in the evening Week 2: Take 2 tablets twice daily

## 2020-02-27 NOTE — Progress Notes (Signed)
Subjective:     Brianna Stephens is a 49 y.o. female presenting for Diabetes (concerns with medications and changes)     HPI  #Diabetes - has been busy - but did go on vacation - not sure what she did different to go from 7.2>9 % on hgba1c - has lost weight - planning to reschedule an appointment to see the dietician - has noticed some hairloss - has been strenuously working out  - does not want to be on all the medication   Wondering about endocrinology referral Diabetes education Medications ozempic  Concerns about weight and hairloss  - normal blood work in 10/2019 - had a normal endoscopy and colonscopy with GI specialist - Dr. [place] [facility] Bynum Bellows - normal pap - normal mammogram  Will take her cholesterol medication 1-2 times a week   Chart Review  02/19/2020: Clinic: previous PCP - started ozempic, Hgb A1c 9.0%   Review of Systems   Social History   Tobacco Use  Smoking Status Current Some Day Smoker  . Packs/day: 0.25  . Years: 22.00  . Pack years: 5.50  . Types: Cigarettes  Smokeless Tobacco Never Used  Tobacco Comment   not daily        Objective:    BP Readings from Last 3 Encounters:  02/27/20 118/80  02/04/20 138/88  10/05/19 122/89   Wt Readings from Last 3 Encounters:  02/27/20 160 lb (72.6 kg)  10/05/19 168 lb (76.2 kg)    BP 118/80   Pulse 76   Temp (!) 96.6 F (35.9 C) (Temporal)   Ht 5' 6.25" (1.683 m)   Wt 160 lb (72.6 kg)   SpO2 100%   BMI 25.63 kg/m    Physical Exam Constitutional:      General: She is not in acute distress.    Appearance: She is well-developed. She is not diaphoretic.  HENT:     Right Ear: External ear normal.     Left Ear: External ear normal.  Eyes:     Conjunctiva/sclera: Conjunctivae normal.  Cardiovascular:     Rate and Rhythm: Normal rate.  Pulmonary:     Effort: Pulmonary effort is normal.  Musculoskeletal:     Cervical back: Neck supple.   Skin:    General: Skin is warm and dry.     Capillary Refill: Capillary refill takes less than 2 seconds.  Neurological:     Mental Status: She is alert. Mental status is at baseline.  Psychiatric:        Mood and Affect: Mood normal.        Behavior: Behavior normal.           Assessment & Plan:   Problem List Items Addressed This Visit      Endocrine   Type 2 diabetes mellitus without complication, without long-term current use of insulin (HCC) - Primary    Poorly controlled. Pt working on diet and exercise. Discussed risk of elevated glucose and that taking ozempic would be beneficial but also not unreasonable to focus on diet/exercise with increasing metformin dose. Endo referral at patient request - though does not weight loss and dramatic shifts in glucose w/o dietary changes so wondering about possible mixed picture but would defer to endocrinology at this time.       Relevant Medications   OZEMPIC, 1 MG/DOSE, 2 MG/1.5ML SOPN   Other Relevant Orders   Ambulatory referral to Endocrinology   Lipid panel   Microalbumin / creatinine urine  ratio   Comprehensive metabolic panel     Other   Hair loss    Notes hair loss and weight loss - will check TSH (though this was normal in February)       Relevant Orders   TSH   T4, free       Return in about 3 months (around 05/29/2020) for diabetes.  Lesleigh Noe, MD  This visit occurred during the SARS-CoV-2 public health emergency.  Safety protocols were in place, including screening questions prior to the visit, additional usage of staff PPE, and extensive cleaning of exam room while observing appropriate contact time as indicated for disinfecting solutions.

## 2020-02-27 NOTE — Assessment & Plan Note (Signed)
Poorly controlled. Pt working on diet and exercise. Discussed risk of elevated glucose and that taking ozempic would be beneficial but also not unreasonable to focus on diet/exercise with increasing metformin dose. Endo referral at patient request - though does not weight loss and dramatic shifts in glucose w/o dietary changes so wondering about possible mixed picture but would defer to endocrinology at this time.

## 2020-02-27 NOTE — Assessment & Plan Note (Signed)
Notes hair loss and weight loss - will check TSH (though this was normal in February)

## 2020-02-28 LAB — COMPREHENSIVE METABOLIC PANEL
ALT: 11 U/L (ref 0–35)
AST: 12 U/L (ref 0–37)
Albumin: 4.8 g/dL (ref 3.5–5.2)
Alkaline Phosphatase: 44 U/L (ref 39–117)
BUN: 11 mg/dL (ref 6–23)
CO2: 28 mEq/L (ref 19–32)
Calcium: 9.4 mg/dL (ref 8.4–10.5)
Chloride: 103 mEq/L (ref 96–112)
Creatinine, Ser: 0.73 mg/dL (ref 0.40–1.20)
GFR: 84.52 mL/min (ref >60.00)
Glucose, Bld: 184 mg/dL — ABNORMAL HIGH (ref 70–99)
Potassium: 3.9 mEq/L (ref 3.5–5.1)
Sodium: 138 mEq/L (ref 135–145)
Total Bilirubin: 0.5 mg/dL (ref 0.2–1.2)
Total Protein: 7.4 g/dL (ref 6.0–8.3)

## 2020-02-28 LAB — LIPID PANEL
Cholesterol: 155 mg/dL (ref 0–200)
HDL: 52.4 mg/dL (ref >39.00)
LDL Cholesterol: 83 mg/dL (ref 0–99)
NonHDL: 102.48
Total CHOL/HDL Ratio: 3
Triglycerides: 95 mg/dL (ref 0.0–149.0)
VLDL: 19 mg/dL (ref 0.0–40.0)

## 2020-02-28 LAB — TSH: TSH: 1.31 u[IU]/mL (ref 0.35–4.50)

## 2020-02-28 LAB — MICROALBUMIN / CREATININE URINE RATIO
Creatinine,U: 96 mg/dL
Microalb Creat Ratio: 4.3 mg/g (ref 0.0–30.0)
Microalb, Ur: 4.1 mg/dL — ABNORMAL HIGH (ref 0.0–1.9)

## 2020-02-28 LAB — T4, FREE: Free T4: 0.85 ng/dL (ref 0.60–1.60)

## 2020-02-29 ENCOUNTER — Encounter: Payer: Self-pay | Admitting: Family Medicine

## 2020-03-01 ENCOUNTER — Other Ambulatory Visit: Payer: Self-pay

## 2020-03-01 ENCOUNTER — Encounter: Payer: Self-pay | Admitting: Endocrinology

## 2020-03-01 ENCOUNTER — Ambulatory Visit: Payer: BC Managed Care – PPO | Admitting: Endocrinology

## 2020-03-01 VITALS — BP 104/70 | HR 77 | Ht 66.25 in | Wt 158.4 lb

## 2020-03-01 DIAGNOSIS — E042 Nontoxic multinodular goiter: Secondary | ICD-10-CM

## 2020-03-01 DIAGNOSIS — E119 Type 2 diabetes mellitus without complications: Secondary | ICD-10-CM | POA: Diagnosis not present

## 2020-03-01 LAB — POCT GLYCOSYLATED HEMOGLOBIN (HGB A1C): Hemoglobin A1C: 8.1 % — AB (ref 4.0–5.6)

## 2020-03-01 MED ORDER — METFORMIN HCL ER 500 MG PO TB24
2000.0000 mg | ORAL_TABLET | Freq: Every day | ORAL | 3 refills | Status: DC
Start: 2020-03-01 — End: 2020-05-01

## 2020-03-01 NOTE — Progress Notes (Signed)
Subjective:    Patient ID: Brianna Stephens, female    DOB: February 16, 1971, 49 y.o.   MRN: [id number]  HPI pt is referred by Dr Einar Pheasant, for diabetes.  Pt states DM was dx'ed in 2020; she is unaware of any chronic complications; she has never been on insulin; pt says her diet and exercise are good; she has never had GDM, pancreatitis, pancreatic surgery, severe hypoglycemia or DKA.  She says cbg varies from 113-187.  Past Medical History:  Diagnosis Date  . Diabetes mellitus without complication ([place])   . Hyperlipidemia     Past Surgical History:  Procedure Laterality Date  . bone spur surgery     both pinky toes  . BREAST REDUCTION SURGERY Bilateral   . CESAREAN SECTION     x 2    Social History   Socioeconomic History  . Marital status: Divorced    Spouse name: Not on file  . Number of children: 2  . Years of education: Some College  . Highest education level: Not on file  Occupational History  . Not on file  Tobacco Use  . Smoking status: Current Some Day Smoker    Packs/day: 0.25    Years: 22.00    Pack years: 5.50    Types: Cigarettes  . Smokeless tobacco: Never Used  . Tobacco comment: not daily  Vaping Use  . Vaping Use: Never used  Substance and Sexual Activity  . Alcohol use: Yes    Comment: wine- 2 days a week  . Drug use: Never  . Sexual activity: Not Currently  Other Topics Concern  . Not on file  Social History Narrative   10/05/19   From: the area   Living: alone, Dog - Elyse Hsu   Work: recently moved for [employer]      Family: 2 children - in [place] and [place] (adults)      Enjoys: travel      Exercise: walking when the weather is nice   Diet: diabetic diet      Safety   Seat belts: Yes    Guns: No   Safe in relationships: Yes    Social Determinants of Radio broadcast assistant Strain:   . Difficulty of Paying Living Expenses:   Food Insecurity:   . Worried About Charity fundraiser in the Last Year:   . Arboriculturist  in the Last Year:   Transportation Needs:   . Film/video editor (Medical):   Marland Kitchen Lack of Transportation (Non-Medical):   Physical Activity:   . Days of Exercise per Week:   . Minutes of Exercise per Session:   Stress:   . Feeling of Stress :   Social Connections:   . Frequency of Communication with Friends and Family:   . Frequency of Social Gatherings with Friends and Family:   . Attends Religious Services:   . Active Member of Clubs or Organizations:   . Attends Archivist Meetings:   Marland Kitchen Marital Status:   Intimate Partner Violence:   . Fear of Current or Ex-Partner:   . Emotionally Abused:   Marland Kitchen Physically Abused:   . Sexually Abused:     Current Outpatient Medications on File Prior to Visit  Medication Sig Dispense Refill  . Accu-Chek FastClix Lancets MISC 1 each by Other route as needed.     . ALPRAZolam (XANAX) 0.5 MG tablet Take 1 tablet by mouth 2 (two) times daily as needed.    Marland Kitchen  atorvastatin (LIPITOR) 20 MG tablet Take 20 mg by mouth See admin instructions. Every Tuesday and Thursday    . fluticasone (FLONASE) 50 MCG/ACT nasal spray Place 1 spray into both nostrils as needed.     Marland Kitchen glucose blood (ACCU-CHEK GUIDE) test strip 1 each by Other route as needed.     Marland Kitchen LINZESS 145 MCG CAPS capsule Take 145 mcg by mouth daily as needed.    . Multiple Vitamin (MULTIVITAMIN) tablet Take 1 tablet by mouth daily.      No current facility-administered medications on file prior to visit.    No Known Allergies  Family History  Problem Relation Age of Onset  . Diabetes Mother   . Hypertension Mother   . Hypertension Father   . Asthma Sister   . Diabetes Paternal Grandmother   . Atrial fibrillation Paternal Grandmother     BP 104/70   Pulse 77   Ht 5' 6.25" (1.683 m)   Wt 158 lb 6.4 oz (71.8 kg)   SpO2 98%   BMI 25.37 kg/m     Review of Systems denies blurry vision, sob, n/v, memory loss, and depression.  She has frequent urination.  She has lost 30 lbs x  1 year.    Objective:   Physical Exam VS: see vs page GEN: no distress HEAD: head: no deformity eyes: no periorbital swelling, no proptosis external nose and ears are normal NECK: 2 thyroid nodules at the RUP (each approx 1 cm) CHEST WALL: no deformity LUNGS: clear to auscultation CV: reg rate and rhythm, no murmur MUSCULOSKELETAL: muscle bulk and strength are grossly normal.  no obvious joint swelling.  gait is normal and steady EXTEMITIES: no deformity.  no ulcer on the feet.  feet are of normal color and temp.  no edema PULSES: dorsalis pedis intact bilat.  no carotid bruit NEURO:  cn 2-12 grossly intact.   readily moves all 4's.  sensation is intact to touch on the feet.   SKIN:  Normal texture and temperature.  No rash or suspicious lesion is visible.   NODES:  None palpable at the neck PSYCH: alert, well-oriented.  Does not appear anxious nor depressed.    Lab Results  Component Value Date   HGBA1C 8.1 (A) 03/01/2020   Lab Results  Component Value Date   CREATININE 0.73 02/27/2020   BUN 11 02/27/2020   NA 138 02/27/2020   K 3.9 02/27/2020   CL 103 02/27/2020   CO2 28 02/27/2020    Lab Results  Component Value Date   TSH 1.31 02/27/2020   [place] (2018): The patient has multinodular goiter as outlined above. First complex  nodule RT inferior pole less than 1.0cm low risk sonographic pattern ,  another pure cystic nodule inferior right pole less than 0.5cm. Does not meet  indication for FNA .  I have reviewed outside records, and summarized: Pt was noted to have elevated A1c, and referred here.  She was seen in 2018 for thyroid nodule.  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 pt requests f/u US Type 2 DM: she needs increased rx.  However, she declines to add another med  Patient Instructions  Let's recheck the ultrasound.  you will receive a phone call, about a day and time for an appointment. good  diet and exercise significantly improve the control of your diabetes.  please let me know if you wish to  be referred to a dietician.  high blood sugar is very risky to your health.  you should see an eye doctor and dentist every year.  It is very important to get all recommended vaccinations.  Controlling your blood pressure and cholesterol drastically reduces the damage diabetes does to your body.  Those who smoke should quit.  Please discuss these with your doctor.  check your blood sugar once a day.  vary the time of day when you check, between before the 3 meals, and at bedtime.  also check if you have symptoms of your blood sugar being too high or too low.  please keep a record of the readings and bring it to your next appointment here (or you can bring the meter itself).  You can write it on any piece of paper.  please call us sooner if your blood sugar goes below 70, or if you have a lot of readings over 200.   Please increase the metformin to 4 pills per day. Please come back for a follow-up appointment in 2 months.

## 2020-03-01 NOTE — Patient Instructions (Addendum)
Let's recheck the ultrasound.  you will receive a phone call, about a day and time for an appointment. good diet and exercise significantly improve the control of your diabetes.  please let me know if you wish to be referred to a dietician.  high blood sugar is very risky to your health.  you should see an eye doctor and dentist every year.  It is very important to get all recommended vaccinations.  Controlling your blood pressure and cholesterol drastically reduces the damage diabetes does to your body.  Those who smoke should quit.  Please discuss these with your doctor.  check your blood sugar once a day.  vary the time of day when you check, between before the 3 meals, and at bedtime.  also check if you have symptoms of your blood sugar being too high or too low.  please keep a record of the readings and bring it to your next appointment here (or you can bring the meter itself).  You can write it on any piece of paper.  please call us sooner if your blood sugar goes below 70, or if you have a lot of readings over 200.   Please increase the metformin to 4 pills per day. Please come back for a follow-up appointment in 2 months.

## 2020-03-15 ENCOUNTER — Ambulatory Visit
Admission: RE | Admit: 2020-03-15 | Discharge: 2020-03-15 | Disposition: A | Discharge status: Home / Self Care | Payer: BC Managed Care – PPO | Source: Ambulatory Visit | Attending: Endocrinology | Admitting: Endocrinology

## 2020-03-15 DIAGNOSIS — E079 Disorder of thyroid, unspecified: Secondary | ICD-10-CM | POA: Diagnosis not present

## 2020-03-15 DIAGNOSIS — E042 Nontoxic multinodular goiter: Secondary | ICD-10-CM | POA: Diagnosis not present

## 2020-03-21 ENCOUNTER — Telehealth: Payer: Self-pay

## 2020-03-21 NOTE — Telephone Encounter (Signed)
Starting on 03/15/20 started with rt lower back and hip pain that hurt like pt had worked out; has more pain and pressure feeling with movement. The lower abd pain is sharp and piercing at times (pain comes and goes) and feels like the pain randomly moves all over the lower abd and pt has to urinate when has the episode of pain. Pt had hx of kidney stones 2011 or 2012 and pt felt this way. Pt urine goes from dark urine to clear urine; pt has not seen blood in urine. Pt is not having problem at this time. Pt scheduled appt with Dr Para March on 03/22/20 at 8 AM; pt will be at [facility] to get checked in at 7:45.Pt has no covid symptoms, no travel and no known exposure to + covid. Pt did travel to [facility] 03/07/20 -03/11/20. Pt did wear mask and has been fully vaccinated. UC & ED precautions given and pt voiced understanding.

## 2020-03-22 ENCOUNTER — Ambulatory Visit: Payer: BC Managed Care – PPO | Admitting: Family Medicine

## 2020-03-22 DIAGNOSIS — Z0289 Encounter for other administrative examinations: Secondary | ICD-10-CM

## 2020-03-22 NOTE — Telephone Encounter (Signed)
Noted. Thanks.

## 2020-03-28 ENCOUNTER — Other Ambulatory Visit: Payer: Self-pay

## 2020-03-28 ENCOUNTER — Encounter: Payer: Self-pay | Admitting: Dietician

## 2020-03-28 ENCOUNTER — Encounter: Payer: BC Managed Care – PPO | Attending: Physician Assistant | Admitting: Dietician

## 2020-03-28 DIAGNOSIS — E119 Type 2 diabetes mellitus without complications: Secondary | ICD-10-CM | POA: Insufficient documentation

## 2020-03-28 NOTE — Patient Instructions (Signed)
Add more resistance exercises (bands, weights, squats). Consider reducing the fat in meals eaten out. Find ways to increase your vegetables.

## 2020-03-28 NOTE — Progress Notes (Signed)
Diabetes Self-Management Education  Visit Type:  Follow-up  Appt. Start Time: 0945 Appt. End Time: 1020  04/01/2020  [name], identified by name and date of birth, is a 49 y.o. female with a diagnosis of Diabetes: Type 2 Diabetes    ASSESSMENT Patient is here today alone. She was last seen by Pincus Large, RD, CDCES 09/05/2019. Patient states that she went on vacation in April and May in [place] and [place].  Her A1C was 9% afterwards. This has decreased to 8.1% 03/01/2020 but was 7.1% 10/05/2019. Medications include Metformin.  She states that she would prefer to control her diabetes with lifestyle. She states that she would like to gain weight (muscle). Lowest weight 156 lbs. She lost from 180 lbs when diagnosed with diabetes.  Body Composition Scale Date 7/22/ 2021  Current Body Weight 159.8 lbs  Total Body Fat % 29.3% 46.3#  Visceral Fat 6  Fat-Free Mass % 70.6% 111.3#   Total Body Water % 49.8% 78.5#  Muscle-Mass lbs 32.8#  BMI 24.7  Body Fat Displacement          Torso  lbs 28.9         Left Leg  lbs 5.7         Right Leg  lbs 5.7         Left Arm  lbs 2.8         Right Arm   lbs 2.8   She continues to walk twice per day.  She drinks a glass of wine on occasion.    Lives with her son and eats out most often.  Does not tolerate yogurt but can tolerate cheese. 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  Breakfast (9:00):  Coffee with plain cream, peach this am OR Sausage egg and cheese mcmuffin OR chick Fil-A chicken minis OR Bojangles Cajan Filet biscuit or side of bacon with grits and cheese OR liver mush sandwich on [employer] Snack:  Belvita or Blueberry Pastry Crisp (100 calorie) or granola bar or triscuits and cheese Lunch:  BLT OR Double Cheeseburger and fries (half bun) OR zucchini strips and greek salad or hot dog all the way or burger at [employer] OR pizza Snack:  Veggi Straws or those above Dinner:  PB & Jelly sandwich OR chicken salad sandwich and  wedding bell soup Snack:  Triscuit, cheese, vegetables, pepperoni, olives, nuts, occasional fruit, peanut butter Beverages:  Coffee with plain cream, water, bubly, occasional part sweet and part unsweetend tea, occasional gingerale, occasional wine  Weight 159 lb 12.8 oz (72.5 kg). Body mass index is 25.6 kg/m.    Diabetes Self-Management Education - 03/31/20 1707      Psychosocial Assessment   Patient Belief/Attitude about Diabetes Motivated to manage diabetes    Self-care barriers None    Self-management support Doctor's office;CDE visits    Patient Concerns Nutrition/Meal planning    Special Needs None    Learning Readiness Ready      Pre-Education Assessment   Patient understands the diabetes disease and treatment process. Needs Review    Patient understands incorporating nutritional management into lifestyle. Needs Review    Patient undertands incorporating physical activity into lifestyle. Needs Review    Patient understands using medications safely. Needs Review    Patient understands monitoring blood glucose, interpreting and using results Needs Review    Patient understands prevention, detection, and treatment of acute complications. Needs Review    Patient understands prevention, detection, and treatment of chronic complications. Needs  Review    Patient understands how to develop strategies to address psychosocial issues. Needs Review    Patient understands how to develop strategies to promote health/change behavior. Needs Review      Complications   Last HgB A1C per patient/outside source 8.1 %      Exercise   Exercise Type Light (walking / raking leaves)    How many days per week to you exercise? 7    How many minutes per day do you exercise? 30    Total minutes per week of exercise 210      Patient Education   Previous Diabetes Education Yes (please comment)   08/2019   Nutrition management  Role of diet in the treatment of diabetes and the relationship between  the three main macronutrients and blood glucose level;Information on hints to eating out and maintain blood glucose control.    Physical activity and exercise  Role of exercise on diabetes management, blood pressure control and cardiac health.    Medications Reviewed patients medication for diabetes, action, purpose, timing of dose and side effects.    Monitoring Identified appropriate SMBG and/or A1C goals.    Psychosocial adjustment Worked with patient to identify barriers to care and solutions;Identified and addressed patients feelings and concerns about diabetes    Personal strategies to promote health Helped patient develop diabetes management plan for (enter comment)      Individualized Goals (developed by patient)   Nutrition General guidelines for healthy choices and portions discussed    Medications take my medication as prescribed    Monitoring  test my blood glucose as discussed    Reducing Risk do foot checks daily;increase portions of healthy fats      Patient Self-Evaluation of Goals - Patient rates self as meeting previously set goals (% of time)   Nutrition 50 - 75 %    Physical Activity >75%    Medications >75%    Monitoring >75%    Problem Solving 50 - 75 %    Reducing Risk 50 - 75 %    Health Coping >75%      Post-Education Assessment   Patient understands the diabetes disease and treatment process. Demonstrates understanding / competency    Patient understands incorporating nutritional management into lifestyle. Needs Review    Patient undertands incorporating physical activity into lifestyle. Demonstrates understanding / competency    Patient understands using medications safely. Demonstrates understanding / competency    Patient understands monitoring blood glucose, interpreting and using results Demonstrates understanding / competency    Patient understands prevention, detection, and treatment of acute complications. Demonstrates understanding / competency     Patient understands prevention, detection, and treatment of chronic complications. Demonstrates understanding / competency    Patient understands how to develop strategies to address psychosocial issues. Demonstrates understanding / competency    Patient understands how to develop strategies to promote health/change behavior. Needs Review      Outcomes   Program Status Not Completed      Subsequent Visit   Since your last visit have you continued or begun to take your medications as prescribed? Yes           Learning Objective:  Patient will have a greater understanding of diabetes self-management. Patient education plan is to attend individual and/or group sessions per assessed needs and concerns.   Plan:   Patient Instructions  Add more resistance exercises (bands, weights, squats). Consider reducing the fat in meals eaten out. Find ways to increase  your vegetables.    Expected Outcomes:  Demonstrated interest in learning. Expect positive outcomes  Education material provided: Meal plan card and Snack sheet, ADA Type 2 Diabetes book with My plate  If problems or questions, patient to contact team via:  Phone  Future DSME appointment: - 3-4 months

## 2020-05-01 ENCOUNTER — Other Ambulatory Visit: Payer: Self-pay

## 2020-05-01 ENCOUNTER — Ambulatory Visit (INDEPENDENT_AMBULATORY_CARE_PROVIDER_SITE_OTHER): Payer: BC Managed Care – PPO | Admitting: Endocrinology

## 2020-05-01 ENCOUNTER — Encounter: Payer: Self-pay | Admitting: Endocrinology

## 2020-05-01 VITALS — BP 118/82 | HR 82 | Ht 66.25 in | Wt 157.4 lb

## 2020-05-01 DIAGNOSIS — E119 Type 2 diabetes mellitus without complications: Secondary | ICD-10-CM

## 2020-05-01 LAB — POCT GLYCOSYLATED HEMOGLOBIN (HGB A1C): Hemoglobin A1C: 7.2 % — AB (ref 4.0–5.6)

## 2020-05-01 MED ORDER — FREESTYLE LIBRE 14 DAY READER DEVI: 1.0000 | Freq: Once | 1 refills | Status: AC

## 2020-05-01 MED ORDER — OZEMPIC (1 MG/DOSE) 4 MG/3ML ~~LOC~~ SOPN: 1.0000 mg | PEN_INJECTOR | SUBCUTANEOUS | 3 refills | Status: AC

## 2020-05-01 MED ORDER — FREESTYLE LIBRE 14 DAY SENSOR MISC: 1.0000 | 3 refills | Status: AC

## 2020-05-01 NOTE — Patient Instructions (Addendum)
  check your blood sugar once a day.  vary the time of day when you check, between before the 3 meals, and at bedtime.  also check if you have symptoms of your blood sugar being too high or too low.  please keep a record of the readings and bring it to your next appointment here (or you can bring the meter itself).  You can write it on any piece of paper.  please call us sooner if your blood sugar goes below 70, or if you have a lot of readings over 200.   Please continue the same Ozempic, and: Stay off the metformin (but if you need it in the future, we can try 1 pill per day).  Please come back for a follow-up appointment in 2 months.

## 2020-05-01 NOTE — Progress Notes (Signed)
Subjective:    Patient ID: Brianna Stephens, female    DOB: 1971/07/24, 49 y.o.   MRN: [id number]  HPI Pt returns for f/u of diabetes mellitus: DM type: 2 Dx'ed: 2020 Complications: none Therapy: Ozempic GDM: never DKA: never Severe hypoglycemia: never Pancreatitis: never Pancreatic imaging: normal on 2020 CT SDOH: none Other: she has never been on insulin Interval history: She did not tolerate metformin (n/v); last week, she stopped this, and got Ozempic from prior dr in [place] (1 mg qd).  She has no nausea on this. She says cbg's are in the low to mid-100's.     Past Medical History:  Diagnosis Date  . Diabetes mellitus without complication (HCC)   . Hyperlipidemia     Past Surgical History:  Procedure Laterality Date  . bone spur surgery     both pinky toes  . BREAST REDUCTION SURGERY Bilateral   . CESAREAN SECTION     x 2    Social History   Socioeconomic History  . Marital status: Divorced    Spouse name: Not on file  . Number of children: 2  . Years of education: Some College  . Highest education level: Not on file  Occupational History  . Not on file  Tobacco Use  . Smoking status: Current Some Day Smoker    Packs/day: 0.25    Years: 22.00    Pack years: 5.50    Types: Cigarettes  . Smokeless tobacco: Never Used  . Tobacco comment: not daily  Vaping Use  . Vaping Use: Never used  Substance and Sexual Activity  . Alcohol use: Yes    Comment: wine- 2 days a week  . Drug use: Never  . Sexual activity: Not Currently  Other Topics Concern  . Not on file  Social History Narrative   10/05/19   From: the area   Living: alone, Dog - Dia Sitter   Work: recently moved for [employer]      Family: 2 children - in [place] - [place] and [place] (adults)      Enjoys: travel      Exercise: walking when the weather is nice   Diet: diabetic diet      Safety   Seat belts: Yes    Guns: No   Safe in relationships: Yes    Social Determinants of Manufacturing engineer Strain:   . Difficulty of Paying Living Expenses: Not on file  Food Insecurity:   . Worried About Programme researcher, broadcasting/film/video in the Last Year: Not on file  . Ran Out of Food in the Last Year: Not on file  Transportation Needs:   . Lack of Transportation (Medical): Not on file  . Lack of Transportation (Non-Medical): Not on file  Physical Activity:   . Days of Exercise per Week: Not on file  . Minutes of Exercise per Session: Not on file  Stress:   . Feeling of Stress : Not on file  Social Connections:   . Frequency of Communication with Friends and Family: Not on file  . Frequency of Social Gatherings with Friends and Family: Not on file  . Attends Religious Services: Not on file  . Active Member of Clubs or Organizations: Not on file  . Attends Banker Meetings: Not on file  . Marital Status: Not on file  Intimate Partner Violence:   . Fear of Current or Ex-Partner: Not on file  . Emotionally Abused: Not on file  .  Physically Abused: Not on file  . Sexually Abused: Not on file    Current Outpatient Medications on File Prior to Visit  Medication Sig Dispense Refill  . Accu-Chek FastClix Lancets MISC 1 each by Other route daily. E11.9    . ALPRAZolam (XANAX) 0.5 MG tablet Take 1 tablet by mouth 2 (two) times daily as needed.    Marland Kitchen atorvastatin (LIPITOR) 20 MG tablet Take 20 mg by mouth See admin instructions. Every Tuesday and Thursday    . fluticasone (FLONASE) 50 MCG/ACT nasal spray Place 1 spray into both nostrils as needed.     Marland Kitchen glucose blood (ACCU-CHEK GUIDE) test strip 1 each by Other route daily. E11.9    . LINZESS 145 MCG CAPS capsule Take 145 mcg by mouth daily as needed.    . Multiple Vitamin (MULTIVITAMIN) tablet Take 1 tablet by mouth daily.      No current facility-administered medications on file prior to visit.    No Known Allergies  Family History  Problem Relation Age of Onset  . Diabetes Mother   . Hypertension Mother   .  Hypertension Father   . Asthma Sister   . Diabetes Paternal Grandmother   . Atrial fibrillation Paternal Grandmother     BP 118/82   Pulse 82   Ht 5' 6.25" (1.683 m)   Wt 157 lb 6.4 oz (71.4 kg)   SpO2 98%   BMI 25.21 kg/m   Review of Systems She denies hypoglycemia.     Objective:   Physical Exam VITAL SIGNS:  See vs page GENERAL: no distress Pulses: dorsalis pedis intact bilat.   MSK: no deformity of the feet CV: no leg edema Skin:  no ulcer on the feet.  normal color and temp on the feet. Neuro: sensation is intact to touch on the feet.     Lab Results  Component Value Date   HGBA1C 7.2 (A) 05/01/2020       Assessment & Plan:  Type 2 DM: uncontrolled Nausea: due to metformin  Patient Instructions   check your blood sugar once a day.  vary the time of day when you check, between before the 3 meals, and at bedtime.  also check if you have symptoms of your blood sugar being too high or too low.  please keep a record of the readings and bring it to your next appointment here (or you can bring the meter itself).  You can write it on any piece of paper.  please call us sooner if your blood sugar goes below 70, or if you have a lot of readings over 200.   Please continue the same Ozempic, and: Stay off the metformin (but if you need it in the future, we can try 1 pill per day).  Please come back for a follow-up appointment in 2 months.

## 2020-05-07 ENCOUNTER — Telehealth: Payer: Self-pay | Admitting: Family Medicine

## 2020-05-07 DIAGNOSIS — E042 Nontoxic multinodular goiter: Secondary | ICD-10-CM

## 2020-05-07 DIAGNOSIS — E119 Type 2 diabetes mellitus without complications: Secondary | ICD-10-CM

## 2020-05-07 NOTE — Telephone Encounter (Signed)
Pt called wanting a referral to [facility] .  She stated she had referral to [facility]  but wants go to [facility].  Please advise when referral has been put

## 2020-05-07 NOTE — Telephone Encounter (Signed)
Updating referral per patient request

## 2020-05-07 NOTE — Telephone Encounter (Signed)
Spoke to pt and let her know about the updated referral sent in.

## 2020-05-14 ENCOUNTER — Telehealth: Payer: Self-pay

## 2020-05-14 MED ORDER — ACCU-CHEK GUIDE VI STRP: 1.0000 | ORAL_STRIP | Freq: Every day | 3 refills | Status: AC

## 2020-05-14 NOTE — Telephone Encounter (Signed)
Did not have any info in med history, on our end, of how often patient is checking BS, so sending this to her Endocrinologist.

## 2020-05-14 NOTE — Telephone Encounter (Signed)
I have sent a prescription to your pharmacy, to refill 

## 2020-05-14 NOTE — Telephone Encounter (Signed)
Pt left v/m for refills on lancets and test strips (pt has a few test strips) but request ASAP [facility]; was not sure if this should go to endo? Sending note to [facility] CMA.

## 2020-06-04 ENCOUNTER — Telehealth: Payer: Self-pay | Admitting: Family Medicine

## 2020-06-04 NOTE — Telephone Encounter (Signed)
Pt took a home test for covid and it resulted positive and wants to know if she needs to get a professional test done at [facility] or somewhere.  She also has some covid questions.  Pt requests c/b [phone]  Thank you!

## 2020-06-04 NOTE — Telephone Encounter (Signed)
Spoke to pt and relayed Dr. Elmyra Ricks message to her. She says as of right now she has a sore throat, some mild tightness in her chest and a headache occasionally. Her symptoms started on Friday, 9/24. She is treating her symptoms with alkaseltzer cold and flu and elderberry. Pt states she will call us if she starts to feel worse.

## 2020-06-04 NOTE — Telephone Encounter (Signed)
No need for official test.   Needs to isolate for 10 days from symptom onset.   Please verify start of symptoms.   If less than 10 days, she may be eligible for monoclonal antibody treatment. Please also review Covid ER precautions - chest pain, sob, etc.   If she has more questions, she could schedule a virtual visit to discuss

## 2020-06-04 NOTE — Telephone Encounter (Signed)
Patient left a voicemail requesting a call back. 

## 2020-06-07 DIAGNOSIS — Z20822 Contact with and (suspected) exposure to covid-19: Secondary | ICD-10-CM | POA: Diagnosis not present

## 2020-06-25 DIAGNOSIS — E119 Type 2 diabetes mellitus without complications: Secondary | ICD-10-CM | POA: Diagnosis not present

## 2020-06-25 DIAGNOSIS — E785 Hyperlipidemia, unspecified: Secondary | ICD-10-CM | POA: Diagnosis not present

## 2020-06-25 DIAGNOSIS — E1169 Type 2 diabetes mellitus with other specified complication: Secondary | ICD-10-CM | POA: Diagnosis not present

## 2020-06-27 ENCOUNTER — Ambulatory Visit: Payer: BC Managed Care – PPO | Admitting: Dietician

## 2020-07-01 ENCOUNTER — Ambulatory Visit: Payer: BC Managed Care – PPO | Admitting: Endocrinology

## 2020-07-04 ENCOUNTER — Emergency Department
Admission: EM | Admit: 2020-07-04 | Discharge: 2020-07-04 | Disposition: A | Discharge status: Home / Self Care | Payer: BC Managed Care – PPO | Attending: Emergency Medicine | Admitting: Emergency Medicine

## 2020-07-04 ENCOUNTER — Emergency Department: Payer: BC Managed Care – PPO

## 2020-07-04 ENCOUNTER — Other Ambulatory Visit: Payer: Self-pay

## 2020-07-04 DIAGNOSIS — G4452 New daily persistent headache (NDPH): Secondary | ICD-10-CM | POA: Diagnosis not present

## 2020-07-04 DIAGNOSIS — E1165 Type 2 diabetes mellitus with hyperglycemia: Secondary | ICD-10-CM

## 2020-07-04 DIAGNOSIS — Z794 Long term (current) use of insulin: Secondary | ICD-10-CM | POA: Insufficient documentation

## 2020-07-04 DIAGNOSIS — R531 Weakness: Secondary | ICD-10-CM | POA: Diagnosis not present

## 2020-07-04 DIAGNOSIS — I10 Essential (primary) hypertension: Secondary | ICD-10-CM | POA: Insufficient documentation

## 2020-07-04 DIAGNOSIS — Z8616 Personal history of COVID-19: Secondary | ICD-10-CM | POA: Insufficient documentation

## 2020-07-04 DIAGNOSIS — F17210 Nicotine dependence, cigarettes, uncomplicated: Secondary | ICD-10-CM | POA: Insufficient documentation

## 2020-07-04 DIAGNOSIS — R42 Dizziness and giddiness: Secondary | ICD-10-CM | POA: Diagnosis not present

## 2020-07-04 DIAGNOSIS — R519 Headache, unspecified: Secondary | ICD-10-CM | POA: Diagnosis not present

## 2020-07-04 LAB — CBC WITH DIFFERENTIAL/PLATELET
Abs Immature Granulocytes: 0.01 10*3/uL (ref 0.00–0.07)
Basophils Absolute: 0 10*3/uL (ref 0.0–0.1)
Basophils Relative: 1 %
Eosinophils Absolute: 0.1 10*3/uL (ref 0.0–0.5)
Eosinophils Relative: 2 %
HCT: 37.2 % (ref 36.0–46.0)
Hemoglobin: 12.8 g/dL (ref 12.0–15.0)
Immature Granulocytes: 0 %
Lymphocytes Relative: 39 %
Lymphs Abs: 1.9 10*3/uL (ref 0.7–4.0)
MCH: 27.5 pg (ref 26.0–34.0)
MCHC: 34.4 g/dL (ref 30.0–36.0)
MCV: 79.8 fL — ABNORMAL LOW (ref 80.0–100.0)
Monocytes Absolute: 0.3 10*3/uL (ref 0.1–1.0)
Monocytes Relative: 6 %
Neutro Abs: 2.5 10*3/uL (ref 1.7–7.7)
Neutrophils Relative %: 52 %
Platelets: 150 10*3/uL (ref 150–400)
RBC: 4.66 MIL/uL (ref 3.87–5.11)
RDW: 14.1 % (ref 11.5–15.5)
WBC: 4.8 10*3/uL (ref 4.0–10.5)
nRBC: 0 % (ref 0.0–0.2)

## 2020-07-04 LAB — COMPREHENSIVE METABOLIC PANEL
ALT: 15 U/L (ref 0–44)
AST: 15 U/L (ref 15–41)
Albumin: 4.5 g/dL (ref 3.5–5.0)
Alkaline Phosphatase: 50 U/L (ref 38–126)
Anion gap: 11 (ref 5–15)
BUN: 14 mg/dL (ref 6–20)
CO2: 27 mmol/L (ref 22–32)
Calcium: 9.5 mg/dL (ref 8.9–10.3)
Chloride: 103 mmol/L (ref 98–111)
Creatinine, Ser: 0.53 mg/dL (ref 0.44–1.00)
GFR, Estimated: >60 mL/min (ref >60)
Glucose, Bld: 200 mg/dL — ABNORMAL HIGH (ref 70–99)
Potassium: 3.8 mmol/L (ref 3.5–5.1)
Sodium: 141 mmol/L (ref 135–145)
Total Bilirubin: 0.7 mg/dL (ref 0.3–1.2)
Total Protein: 7.3 g/dL (ref 6.5–8.1)

## 2020-07-04 LAB — URINALYSIS, COMPLETE (UACMP) WITH MICROSCOPIC
Bacteria, UA: NONE SEEN
Bilirubin Urine: NEGATIVE
Glucose, UA: >=500 mg/dL — AB
Hgb urine dipstick: NEGATIVE
Ketones, ur: NEGATIVE mg/dL
Leukocytes,Ua: NEGATIVE
Nitrite: NEGATIVE
Protein, ur: NEGATIVE mg/dL
Specific Gravity, Urine: 1.012 (ref 1.005–1.030)
pH: 5 (ref 5.0–8.0)

## 2020-07-04 MED ORDER — SODIUM CHLORIDE 0.9 % IV BOLUS
500.0000 mL | Freq: Once | INTRAVENOUS | Status: AC
  Administered 2020-07-04: 500 mL via INTRAVENOUS

## 2020-07-04 MED ORDER — EXCEDRIN MIGRAINE 250-250-65 MG PO TABS: 1.0000 | ORAL_TABLET | Freq: Four times a day (QID) | ORAL | 0 refills | Status: AC | PRN

## 2020-07-04 MED ORDER — IOHEXOL 350 MG/ML SOLN
75.0000 mL | Freq: Once | INTRAVENOUS | Status: AC | PRN
  Administered 2020-07-04: 75 mL via INTRAVENOUS
  Filled 2020-07-04: qty 75

## 2020-07-04 NOTE — ED Provider Notes (Signed)
[facility] Emergency Department Provider Note  ____________________________________________  Time seen: Approximately 3:59 PM  I have reviewed the triage vital signs and the nursing notes.   HISTORY  Chief Complaint Headache    HPI Brianna Stephens is a 48 y.o. female who presents the emergency department with multiple complaints.  Patient states that her headache of 3 weeks is what brought her to the emergency department.  She states that she has had a daily headache that is "in different places" every single day.  Patient states that she feels like it is primarily on the right side but is frequently on the left as well.  She states that it is a pressure, "almost like a bubble needs to pop through my skull."  Patient has no history of headaches or migraines.  She is taking over-the-counter medication with no relief.  She denies any trauma precipitating her headache.  No neck pain or stiffness.  She denies any fevers or chills, URI symptoms, cough or shortness of breath.  Patient is also complaining of some dizziness, weakness, short-term memory issues.  She states that she is a diabetic, was diagnosed 2 years ago.  Originally she was on metformin but had complications secondary to the Metformin.  She then transition to Ozempic but states that it was not controlling her diabetes well.  Her primary care provider has stopped all oral medications for her diabetes and states that she would like for her to go for a time.  Without medications and redraw A1c to see where they are at.  Patient states that typically even without her medication she was in the mid to low 100s as far as daily blood glucose readings.  Patient states that her A1c has been in the mid to low eights.  Patient states that she stopped her diabetic medications approximately a month ago, a week before symptoms started.  Patient states that she has not been taking her daily blood glucose readings as she has not  been taking medication.  Patient did take her blood glucose this morning and it was 256 when she woke up.  Patient states that she took 1 dose of metformin today.  No medications for her complaints prior to arrival.  When asked about any other symptoms initially patient denied any symptoms but then stated that she had noticed over the past couple weeks that she has polyuria, polydipsia, has had some short-term memory difficulty as well as "feeling lost driving through areas that I know well."         Past Medical History:  Diagnosis Date  . Diabetes mellitus without complication (HCC)   . Hyperlipidemia     Patient Active Problem List   Diagnosis Date Noted  . Multinodular goiter 03/01/2020  . Hair loss 02/27/2020  . Poor short term memory 10/05/2019  . Type 2 diabetes mellitus without complication, without long-term current use of insulin (HCC) 10/05/2019  . Exposure to COVID-19 virus 10/05/2019    Past Surgical History:  Procedure Laterality Date  . bone spur surgery     both pinky toes  . BREAST REDUCTION SURGERY Bilateral   . CESAREAN SECTION     x 2    Prior to Admission medications   Medication Sig Start Date End Date Taking? Authorizing Provider  Accu-Chek FastClix Lancets MISC 1 each by Other route daily. E11.9 11/07/18   [provider]  ALPRAZolam Prudy Feeler) 0.5 MG tablet Take 1 tablet by mouth 2 (two) times daily as needed.  10/19/18   [provider]  atorvastatin (LIPITOR) 20 MG tablet Take 20 mg by mouth See admin instructions. Every Tuesday and Thursday 09/03/19   [provider]  Continuous Blood Gluc Sensor (FREESTYLE LIBRE 14 DAY SENSOR) MISC 1 Device by Does not apply route every 14 (fourteen) days. 05/01/20   Romero Belling, MD  fluticasone [facility]) 50 MCG/ACT nasal spray Place 1 spray into both nostrils as needed.  06/12/19 06/11/20  [provider]  glucose blood (ACCU-CHEK GUIDE) test strip 1 each by Other route daily. And lancets  1/day 05/14/20   Romero Belling, MD  LINZESS 145 MCG CAPS capsule Take 145 mcg by mouth daily as needed. 06/05/19   [provider]  Multiple Vitamin (MULTIVITAMIN) tablet Take 1 tablet by mouth daily.     [provider]  Semaglutide, 1 MG/DOSE, (OZEMPIC, 1 MG/DOSE,) 4 MG/3ML SOPN Inject 0.75 mLs (1 mg total) into the skin once a week. 05/01/20   Romero Belling, MD    Allergies Patient has no known allergies.  Family History  Problem Relation Age of Onset  . Diabetes Mother   . Hypertension Mother   . Hypertension Father   . Asthma Sister   . Diabetes Paternal Grandmother   . Atrial fibrillation Paternal Grandmother     Social History Social History   Tobacco Use  . Smoking status: Current Some Day Smoker    Packs/day: 0.25    Years: 22.00    Pack years: 5.50    Types: Cigarettes  . Smokeless tobacco: Never Used  . Tobacco comment: not daily  Vaping Use  . Vaping Use: Never used  Substance Use Topics  . Alcohol use: Yes    Comment: wine- 2 days a week  . Drug use: Never     Review of Systems  Constitutional: No fever/chills Eyes: No visual changes. No discharge ENT: No upper respiratory complaints. Cardiovascular: no chest pain. Respiratory: no cough. No SOB. Gastrointestinal: No abdominal pain.  No nausea, no vomiting.  No diarrhea.  No constipation. Genitourinary: Negative for dysuria. No hematuria.   Musculoskeletal: Negative for musculoskeletal pain. Skin: Negative for rash, abrasions, lacerations, ecchymosis. Neurological: New persistent daily headache.  Denies focal weakness or numbness.  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  10 System ROS otherwise negative.  ____________________________________________   PHYSICAL EXAM:  VITAL SIGNS: ED Triage Vitals  Enc Vitals Group     BP 07/04/20 1454 121/82     Pulse Rate 07/04/20 1454 94     Resp 07/04/20 1454 16     Temp 07/04/20  1454 98.4 F (36.9 C)     Temp Source 07/04/20 1454 Oral     SpO2 07/04/20 1454 97 %     Weight 07/04/20 1456 150 lb (68 kg)     Height 07/04/20 1456  (1.676 m)     Head Circumference --      Peak Flow --      Pain Score 07/04/20 1456 3     Pain Loc --      Pain Edu? --      Excl. in GC? --      Constitutional: Alert and oriented. Well appearing and in no acute distress. Eyes: Conjunctivae are normal. PERRL. EOMI. Head: Atraumatic. ENT:      Ears:       Nose: No congestion/rhinnorhea.      Mouth/Throat: Mucous membranes are moist.  Neck: No stridor.  Cardiovascular: Normal rate, regular rhythm. Normal S1 and S2.  Good peripheral circulation. Respiratory: Normal respiratory effort without tachypnea or retractions. Lungs CTAB. Good air entry to the bases with no decreased or absent breath sounds. Musculoskeletal: Full range of motion to all extremities. No gross deformities appreciated. Neurologic:  Normal speech and language. No gross focal neurologic deficits are appreciated.  Cranial nerves II through XII grossly intact.  Negative Romberg's and pronator drift.  Equal grip strength in the upper extremities.  Skin:  Skin is warm, dry and intact. No rash noted. Psychiatric: Mood and affect are normal. Speech and behavior are normal. Patient exhibits appropriate insight and judgement.   ____________________________________________   LABS (all labs ordered are listed, but only abnormal results are displayed)  Labs Reviewed  URINALYSIS, COMPLETE (UACMP) WITH MICROSCOPIC - Abnormal; Notable for the following components:      Result Value   Color, Urine YELLOW (*)    APPearance CLEAR (*)    Glucose, UA >=500 (*)    All other components within normal limits  CBC WITH DIFFERENTIAL/PLATELET - Abnormal; Notable for the following components:   MCV 79.8 (*)    All other components within normal limits  COMPREHENSIVE METABOLIC PANEL - Abnormal; Notable for the following  components:   Glucose, Bld 200 (*)    All other components within normal limits   ____________________________________________  EKG   ____________________________________________  RADIOLOGY I personally viewed and evaluated these images as part of my medical decision making, as well as reviewing the written report by the radiologist.  ED Provider Interpretation: I agree with radiologist finding of no acute intracranial abnormality  CT Angio Head W or Wo Contrast  Result Date: 07/04/2020 CLINICAL DATA:  Dizziness. New daily headache. Short-term memory loss. EXAM: CT ANGIOGRAPHY HEAD TECHNIQUE: Multidetector CT imaging of the head was performed using the standard protocol during bolus administration of intravenous contrast. Multiplanar CT image reconstructions and MIPs were obtained to evaluate the vascular anatomy. CONTRAST:  72mL OMNIPAQUE IOHEXOL 350 MG/ML SOLN COMPARISON:  None. FINDINGS: CT HEAD Brain: The brain shows a normal appearance without evidence of malformation, atrophy, old or acute small or large vessel infarction, mass lesion, hemorrhage, hydrocephalus or extra-axial collection. Vascular: No hyperdense vessel. No evidence of atherosclerotic calcification. Skull: Normal.  No traumatic finding.  No focal bone lesion. Sinuses/Orbits: Sinuses are clear. Orbits appear normal. Mastoids are clear. Other: None significant CTA HEAD Anterior circulation: Both internal carotid arteries are widely patent through the skull base and siphon regions. No siphon stenosis. The anterior and middle cerebral vessels are normal without proximal stenosis, aneurysm or vascular malformation. No vessel occlusion. Posterior circulation: Both vertebral arteries widely patent through the foramen magnum to the basilar. No basilar stenosis. Posterior circulation branch vessels are normal. Venous sinuses: Patent and normal. Anatomic variants: None significant. IMPRESSION: 1. Normal head CT. 2. Normal intracranial  CT angiography. No large or medium vessel occlusion, stenosis or aneurysm. Electronically Signed   By: Paulina Fusi M.D.   On: 07/04/2020 18:42    ____________________________________________    PROCEDURES  Procedure(s) performed:    Procedures    Medications  sodium chloride 0.9 % bolus 500 mL (0 mLs Intravenous Stopped 07/04/20 1826)  iohexol (OMNIPAQUE) 350 MG/ML injection 75 mL (75 mLs Intravenous Contrast Given 07/04/20 1816)     ____________________________________________   INITIAL IMPRESSION / ASSESSMENT AND PLAN / ED COURSE  Pertinent labs & imaging results that were available during my care of the patient were reviewed by me  and considered in my medical decision making (see chart for details).  Review of the [place] CSRS was performed in accordance of the [facility] prior to dispensing any controlled drugs.  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  Overall exam is reassuring.  She states that she has a diabetic, was instructed to stop her diabetes medication over the past month.  She has not been checking her daily blood glucose levels as she was told to stop her medications.  Patient is having polyuria, polydipsia, headaches, dizziness, confusion.  This time I will check labs, perform imaging of the head.  Differential includes CVA, hyperglycemia, DKA, tension type headaches, migraine, dehydration.   [JC]  1729 Reviewed patient's medical records, her last visit to endocrinology did confirm that the patient was to be off medications for diabetes.  It refers to "continue not taking diabetic medications."  This visit was 9 days ago on 19 October.  The previous note in the system had patient on her diabetes medication at the start of September.  Unsure when patient actually stopped her  diabetes medications.   [JC]    Clinical Course User Index [JC] Elza Varricchio, Delorise RoyalsJonathan D, PA-C          Patient's diagnosis is consistent with headache, hyperglycemia.  Patient presented to the emergency department complaining of multiple symptoms including new daily headache of 3 weeks time.  Patient states that approximately a month ago she was stopped on her diabetes medications.  She states that she has not been checking her blood glucose levels until today.  She has had a daily headache x3 weeks.  This is been ongoing, never fully resolving.  She states that it is migrational with both right and left sided headaches.  Patient was neurologically intact.  Exam was otherwise reassuring.  Given her complaints of headache, dizziness, short-term memory issues, feeling like she has lost while driving in an area that she is familiar with, I evaluated the patient with labs and imaging.  Differential included CVA migraine, tension type headache, diabetic complication.  Patient's blood glucose was 200.  She states that it was 256 this morning after sleeping before eating.  She did take a dose of Metformin today.  At this time, CT scan of the head reveals no acute intracranial abnormality.  Labs are reassuring with the exception that her blood glucose is 200.  At this time, I feel that patient symptoms are likely secondary to hyperglycemia from her diabetes.  I have discussed starting to take her blood glucose readings on a daily basis.  She needs to call her endocrinologist to discuss the fact that she is now symptomatic after stopping her medications.  I have given instructions to return for any sudden change, new or worsening symptoms.  I have discussed her blood glucose readings as well and if they start trending higher and higher she should follow-up in a timely manner with primary care or endocrinology or return to the emergency department.  Signs and symptoms of DKA are discussed with the patient.  At this  time I have discussed talking with her endocrinologist to restart medications and as he had wanted her off of the medications, I will not restart medicines at emergency department.  Follow-up with endocrinology/primary care.  Patient is given ED precautions to return to the  ED for any worsening or new symptoms.     ____________________________________________  FINAL CLINICAL IMPRESSION(S) / ED DIAGNOSES  Final diagnoses:  New daily persistent headache  Hyperglycemia due to diabetes mellitus (HCC)      NEW MEDICATIONS STARTED DURING THIS VISIT:  ED Discharge Orders    None          This chart was dictated using voice recognition software/Dragon. Despite best efforts to proofread, errors can occur which can change the meaning. Any change was purely unintentional.    Racheal Patches, PA-C 07/04/20 1925    Delton Prairie, MD 07/05/20 0001

## 2020-07-04 NOTE — ED Triage Notes (Signed)
Pt states for the past 2-3 weeks she has been having a headache off and on that is moving around- today she feels it in the front of her head, behind her eye

## 2020-07-04 NOTE — ED Notes (Signed)
Pt denied pain or sob

## 2020-07-05 DIAGNOSIS — E119 Type 2 diabetes mellitus without complications: Secondary | ICD-10-CM | POA: Diagnosis not present

## 2020-07-05 DIAGNOSIS — E1169 Type 2 diabetes mellitus with other specified complication: Secondary | ICD-10-CM | POA: Diagnosis not present

## 2020-07-05 DIAGNOSIS — E785 Hyperlipidemia, unspecified: Secondary | ICD-10-CM | POA: Diagnosis not present

## 2020-07-21 ENCOUNTER — Encounter: Payer: Self-pay | Admitting: Emergency Medicine

## 2020-07-21 ENCOUNTER — Other Ambulatory Visit: Payer: Self-pay

## 2020-07-21 ENCOUNTER — Emergency Department
Admission: EM | Admit: 2020-07-21 | Discharge: 2020-07-21 | Disposition: A | Discharge status: Home / Self Care | Payer: BC Managed Care – PPO | Attending: Emergency Medicine | Admitting: Emergency Medicine

## 2020-07-21 DIAGNOSIS — K649 Unspecified hemorrhoids: Secondary | ICD-10-CM | POA: Insufficient documentation

## 2020-07-21 DIAGNOSIS — F17210 Nicotine dependence, cigarettes, uncomplicated: Secondary | ICD-10-CM | POA: Diagnosis not present

## 2020-07-21 DIAGNOSIS — E119 Type 2 diabetes mellitus without complications: Secondary | ICD-10-CM | POA: Diagnosis not present

## 2020-07-21 DIAGNOSIS — K6289 Other specified diseases of anus and rectum: Secondary | ICD-10-CM | POA: Diagnosis not present

## 2020-07-21 DIAGNOSIS — Z7982 Long term (current) use of aspirin: Secondary | ICD-10-CM | POA: Insufficient documentation

## 2020-07-21 MED ORDER — HYDROCORTISONE ACETATE 25 MG RE SUPP: 25.0000 mg | Freq: Two times a day (BID) | RECTAL | 1 refills | Status: AC

## 2020-07-21 MED ORDER — DIBUCAINE (PERIANAL) 1 % EX OINT: 1.0000 "application " | TOPICAL_OINTMENT | CUTANEOUS | 0 refills | Status: AC | PRN

## 2020-07-21 MED ORDER — HYDROCODONE-ACETAMINOPHEN 5-325 MG PO TABS
1.0000 | ORAL_TABLET | Freq: Once | ORAL | Status: AC
  Administered 2020-07-21: 1 via ORAL
  Filled 2020-07-21: qty 1

## 2020-07-21 MED ORDER — ONDANSETRON 4 MG PO TBDP
4.0000 mg | ORAL_TABLET | Freq: Once | ORAL | Status: AC
  Administered 2020-07-21: 4 mg via ORAL
  Filled 2020-07-21: qty 1

## 2020-07-21 MED ORDER — BALNEOL EX LOTN: 1.0000 "application " | TOPICAL_LOTION | Freq: Every day | CUTANEOUS | 0 refills | Status: AC

## 2020-07-21 NOTE — Discharge Instructions (Signed)
You have been prescribed a dibucaine, Anusol and Balneol for your hemorrhoids. Please follow-up with general surgery, Dr. Tonna Boehringer if hemorrhoids persist.

## 2020-07-21 NOTE — ED Notes (Signed)
Pt call light for TUCKS PADS, pt was unknown status d/t being in wrong room, provider aware

## 2020-07-21 NOTE — ED Notes (Addendum)
Pt c/o constant burning pain at rectum associated with "feels like a hemorrhoid"   Pt reports some light bleeding with wiping stool this am, pt counseled on stool softeners and increased fiber diet

## 2020-07-21 NOTE — ED Notes (Signed)
Pt declined dispo vitals

## 2020-07-21 NOTE — ED Triage Notes (Signed)
Pt arrived via POV with reports of possible hemorrhoid pain, pt states she had hard stool on Friday and states since then has had pain, pt has been using prep-H and tucks with minimal relief.

## 2020-07-21 NOTE — ED Provider Notes (Signed)
Emergency Department Provider Note  ____________________________________________  Time seen: Approximately 11:11 PM  I have reviewed the triage vital signs and the nursing notes.   HISTORY  Chief Complaint Rectal Pain   Historian Patient   HPI Brianna Stephens is a 49 y.o. female presents to the emergency department with concern for possible hemorrhoid.  Patient states that hemorrhoid appeared today.  Patient states that she has had a small amount of blood on toilet paper with wiping.  States that she has had hemorrhoids in the past and her current symptoms feel similar.  No heavy lifting or other straining type activities.  No history of prolapse.  No other alleviating measures have been attempted.   Past Medical History:  Diagnosis Date  . Diabetes mellitus without complication (HCC)   . Hyperlipidemia      Immunizations up to date:  Yes.     Past Medical History:  Diagnosis Date  . Diabetes mellitus without complication (HCC)   . Hyperlipidemia     Patient Active Problem List   Diagnosis Date Noted  . Multinodular goiter 03/01/2020  . Hair loss 02/27/2020  . Poor short term memory 10/05/2019  . Type 2 diabetes mellitus without complication, without long-term current use of insulin (HCC) 10/05/2019  . Exposure to COVID-19 virus 10/05/2019    Past Surgical History:  Procedure Laterality Date  . bone spur surgery     both pinky toes  . BREAST REDUCTION SURGERY Bilateral   . CESAREAN SECTION     x 2    Prior to Admission medications   Medication Sig Start Date End Date Taking? Authorizing Provider  Accu-Chek FastClix Lancets MISC 1 each by Other route daily. E11.9 11/07/18   [provider]  ALPRAZolam Prudy Feeler) 0.5 MG tablet Take 1 tablet by mouth 2 (two) times daily as needed. 10/19/18   [provider]  aspirin-acetaminophen-caffeine (EXCEDRIN MIGRAINE) [phone] MG tablet Take 1 tablet by mouth every 6 (six) hours as needed for  headache. 07/04/20   Cuthriell, Delorise Royals, PA-C  atorvastatin (LIPITOR) 20 MG tablet Take 20 mg by mouth See admin instructions. Every Tuesday and Thursday 09/03/19   [provider]  Continuous Blood Gluc Sensor (FREESTYLE LIBRE 14 DAY SENSOR) MISC 1 Device by Does not apply route every 14 (fourteen) days. 05/01/20   Romero Belling, MD  dibucaine (NUPERCAINAL) 1 % OINT Place 1 application rectally as needed for hemorrhoids. 07/21/20   Orvil Feil, PA-C  fluticasone (FLONASE) 50 MCG/ACT nasal spray Place 1 spray into both nostrils as needed.  06/12/19 06/11/20  [provider]  glucose blood (ACCU-CHEK GUIDE) test strip 1 each by Other route daily. And lancets 1/day 05/14/20   Romero Belling, MD  hydrocortisone (ANUSOL-HC) 25 MG suppository Place 1 suppository (25 mg total) rectally every 12 (twelve) hours. 07/21/20 07/21/21  Orvil Feil, PA-C  Incontinent Wash [place]) LOTN Apply 1 application topically daily. 07/21/20   Pia Mau M, PA-C  LINZESS 145 MCG CAPS capsule Take 145 mcg by mouth daily as needed. 06/05/19   [provider]  Multiple Vitamin (MULTIVITAMIN) tablet Take 1 tablet by mouth daily.     [provider]  Semaglutide, 1 MG/DOSE, (OZEMPIC, 1 MG/DOSE,) 4 MG/3ML SOPN Inject 0.75 mLs (1 mg total) into the skin once a week. 05/01/20   Romero Belling, MD    Allergies Patient has no known allergies.  Family History  Problem Relation Age of Onset  . Diabetes Mother   . Hypertension Mother   .  Hypertension Father   . Asthma Sister   . Diabetes Paternal Grandmother   . Atrial fibrillation Paternal Grandmother     Social History Social History   Tobacco Use  . Smoking status: Current Some Day Smoker    Packs/day: 0.25    Years: 22.00    Pack years: 5.50    Types: Cigarettes  . Smokeless tobacco: Never Used  . Tobacco comment: not daily  Vaping Use  . Vaping Use: Never used  Substance Use Topics  . Alcohol use: Yes    Comment:  wine- 2 days a week  . Drug use: Never     Review of Systems  Constitutional: No fever/chills Eyes:  No discharge ENT: No upper respiratory complaints. Respiratory: no cough. No SOB/ use of accessory muscles to breath Gastrointestinal:   No nausea, no vomiting.  No diarrhea.  No constipation. Genitourinary: Patient has stage IV prolapsed hemorrhoid. Musculoskeletal: Negative for musculoskeletal pain. Skin: Negative for rash, abrasions, lacerations, ecchymosis.   ____________________________________________   PHYSICAL EXAM:  VITAL SIGNS: ED Triage Vitals [07/21/20 2053]  Enc Vitals Group     BP (!) 153/99     Pulse Rate 97     Resp 18     Temp 99.1 F (37.3 C)     Temp Source Oral     SpO2 100 %     Weight 150 lb (68 kg)     Height 5' 6.75" (1.695 m)     Head Circumference      Peak Flow      Pain Score 8     Pain Loc      Pain Edu?      Excl. in GC?      Constitutional: Alert and oriented. Well appearing and in no acute distress. Eyes: Conjunctivae are normal. PERRL. EOMI. Head: Atraumatic. Cardiovascular: Normal rate, regular rhythm. Normal S1 and S2.  Good peripheral circulation. Respiratory: Normal respiratory effort without tachypnea or retractions. Lungs CTAB. Good air entry to the bases with no decreased or absent breath sounds Gastrointestinal: Bowel sounds x 4 quadrants. Soft and nontender to palpation. No guarding or rigidity. No distention.  Patient has stage IV prolapsed hemorrhoid with no evidence of thrombosis. Musculoskeletal: Full range of motion to all extremities. No obvious deformities noted Neurologic:  Normal for age. No gross focal neurologic deficits are appreciated.  Skin:  Skin is warm, dry and intact. No rash noted. Psychiatric: Mood and affect are normal for age. Speech and behavior are normal.   ____________________________________________   LABS (all labs ordered are listed, but only abnormal results are displayed)  Labs Reviewed -  No data to display ____________________________________________  EKG   ____________________________________________  RADIOLOGY   No results found.  ____________________________________________    PROCEDURES  Procedure(s) performed:     Procedures     Medications  HYDROcodone-acetaminophen (NORCO/VICODIN) 5-325 MG per tablet 1 tablet (1 tablet Oral Given 07/21/20 2237)  ondansetron (ZOFRAN-ODT) disintegrating tablet 4 mg (4 mg Oral Given 07/21/20 2237)     ____________________________________________   INITIAL IMPRESSION / ASSESSMENT AND PLAN / ED COURSE  Pertinent labs & imaging results that were available during my care of the patient were reviewed by me and considered in my medical decision making (see chart for details).    Assessment and Plan  Hemorrhoids 49 year old female presents to the emergency department with a stage IV nonthrombosed hemorrhoid  Patient was hypertensive at triage but vital signs were otherwise reassuring.  Original differential diagnosis included prolapse  versus hemorrhoid.  I had my attending, Dr. Darnelle Catalan personally evaluate the patient and he agreed with the diagnosis of stage IV nonthrombosed hemorrhoid.  Patient was discharged with dibucaine, Anusol and Baleol.   She was given a referral to general surgery due to large size of hemorrhoid.  Return precautions were given to return with new or worsening symptoms.   ____________________________________________  FINAL CLINICAL IMPRESSION(S) / ED DIAGNOSES  Final diagnoses:  Hemorrhoids, unspecified hemorrhoid type      NEW MEDICATIONS STARTED DURING THIS VISIT:  ED Discharge Orders         Ordered    dibucaine (NUPERCAINAL) 1 % OINT  As needed        07/21/20 2241    Incontinent Wash (BALNEOL) LOTN  Daily        07/21/20 2241    hydrocortisone (ANUSOL-HC) 25 MG suppository  Every 12 hours        07/21/20 2241              This chart was dictated using voice  recognition software/Dragon. Despite best efforts to proofread, errors can occur which can change the meaning. Any change was purely unintentional.     Orvil Feil, PA-C 07/21/20 2320    Arnaldo Natal, MD 07/21/20 [phone]

## 2020-07-23 DIAGNOSIS — K8050 Calculus of bile duct without cholangitis or cholecystitis without obstruction: Secondary | ICD-10-CM | POA: Diagnosis not present

## 2020-07-23 DIAGNOSIS — K645 Perianal venous thrombosis: Secondary | ICD-10-CM | POA: Diagnosis not present

## 2020-07-24 ENCOUNTER — Ambulatory Visit: Payer: Self-pay | Admitting: Surgery

## 2020-07-24 NOTE — H&P (Signed)
Subjective:  CC: Thrombosed hemorrhoids [K64.5] ,   HPI:  Brianna Stephens is a 49 y.o. female who was referrred by Emergency Room for above. Symptoms were first noted a few days ago. Hx of constipation and noticed some bulging in anal area after hard BM.   she has some rectal bleeding. Bleeding is described as just notes blood on tissue paper.  Associated with itching, exacerbated by nothing specific.    Patient denies a personal history of colon cancer. Patient denies a personal history of IBD. Patient denies to history of polyps.  Last colonoscopy one year ago. Unsure when next followup is.  Pt also had lap chole scheduled with different provider prior to COVID.  Requesting surgery to be done here now.  No more episodes reported, but still will like to proceed.   Past Medical History:  has a past medical history of Chicken pox, Diabetes mellitus without complication (CMS-HCC), Fibroid, H/O screening mammography (09/2017), and Pap smear for cervical cancer screening (10/2016).  Past Surgical History:  has a past surgical history that includes Cesarean section; Breast surgery; and Tubal ligation.  Family History: family history includes Pancreatic cancer in her maternal grandfather, paternal aunt, and paternal grandfather. colon cancer in maternal and paternal grandfather  Social History:  reports that she has been smoking. She has never used smokeless tobacco. She reports current alcohol use. She reports that she does not use drugs.  Current Medications: has a current medication list which includes the following prescription(s): alprazolam, atorvastatin, and sitagliptin.  Allergies:  Allergies as of 07/23/2020  . (No Known Allergies)    ROS:  A 15 point review of systems was performed and pertinent positives and negatives noted in HPI  Objective:     BP 137/84   Pulse 92   Ht 172.1 cm (5' 7.75")   Wt 69.4 kg (153 lb)   BMI 23.44 kg/m   Constitutional :  alert, appears  stated age, cooperative and no distress  Lymphatics/Throat::  no asymmetry, masses, or scars  Respiratory:  clear to auscultation bilaterally  Cardiovascular:  regular rate and rhythm  Gastrointestinal: soft, non-tender; bowel sounds normal; no masses,  no organomegaly.    Musculoskeletal: Steady gait and movement  Skin: Cool and moist  Psychiatric: Normal affect, non-agitated, not confused  Genital/Rectal: Chaperone present for exam.  Large, swollen, external hemorrhoid with no ulceration, some TTP, consistent with thrombosed hemorrhoid    LABS:  n/a   RADS: n/a Assessment:     Thrombosed hemorrhoids [K64.5]  Biliary colic Plan:    1.  Thrombosed hemorrhoids [K64.5] Discussed risks/benefits/alternatives to I&D.  Alternatives include the options of observation, medical management.  Benefits include symptomatic relief.  Risk include reoperation, bleeding, infection.  I explained to her specifically that duration of symptoms and size of hemorrhoid makes it unlikely this will resolve on its own, and there is potential for worsening of symptoms.  I also explained hemorrhoidectomy in OR can be an option, but that maybe excessive with a hemorrhoid that can potentially be resolved with a quick procedure in the office.    ED return precautions given for sudden increase in pain, bleeding, with possible accompanying fever, nausea, and/or vomiting.  The patient understands the risks, any and all questions were answered to the patient's satisfaction.  Patient has elected to proceed with I&D in office. Procedure noted below.  Pre-Op Dx: Thrombosed hemorrhoids [K64.5] Post-Op Dx: same Anesthesia: local  EBL: minimal Complications:  none apparent  Procedure: inicsion of thrombosed  hemorrhoid  Description of Procedure:  Consent obtained, time out performed.  Patient placed in left lateral position.  Area sterilized and draped in usual position.  Local anesthesia infused at base of hemorrhoid  as well as on planned incision site. After confirming adequate anesthesia, an elliptical incision was made at apex of obviously thrombosed hemorrhoid on right lateral aspect.  Blood clot was immediately visible and removed without any issue.  Smaller clots subsequently removed with probing of rest of hemorrhoid with hemostats.  All clots removed without any issues.  Wound noted to be hemostatic at end of procedure.  Covered with 4x4.  Pt tolerated procedure well.  2. Discussed the risk of robotic assisted laparoscopic cholecystectomy including post-op infxn, seroma, biloma, chronic pain, poor-delayed wound healing, retained gallstone, conversion to open procedure, post-op SBO or ileus, and need for additional procedures to address said risks.  The risks of general anesthetic including MI, CVA, sudden death or even reaction to anesthetic medications also discussed. Alternatives include continued observation.  Benefits include possible symptom relief, prevention of complications including acute cholecystitis, pancreatitis.  Typical post operative recovery of 3-5 days rest, continued pain in area and incision sites, possible loose stools up to 4-6 weeks, also discussed.  ED return precautions given for sudden increase in RUQ pain, with possible accompanying fever, nausea, and/or vomiting.  The patient understands the risks, any and all questions were answered to the patient's satisfaction.  Patient has elected to proceed with surgical treatment. Procedure will be scheduled.  Written consent was obtained..robotic assisted laparoscopic.  NO GUARANTEE SYMPTOMS WILL RESOLVE DUE TO NORMAL HIDA AND NO OBVIOUS FINDINGS ON [place] AT OSH.

## 2020-07-26 ENCOUNTER — Ambulatory Visit: Payer: BC Managed Care – PPO | Admitting: Dietician

## 2020-07-26 DIAGNOSIS — E119 Type 2 diabetes mellitus without complications: Secondary | ICD-10-CM | POA: Diagnosis not present

## 2020-07-31 ENCOUNTER — Other Ambulatory Visit: Admission: RE | Admit: 2020-07-31 | Payer: BC Managed Care – PPO | Source: Ambulatory Visit

## 2020-08-05 ENCOUNTER — Other Ambulatory Visit: Payer: BC Managed Care – PPO

## 2020-08-06 DIAGNOSIS — E559 Vitamin D deficiency, unspecified: Secondary | ICD-10-CM | POA: Diagnosis not present

## 2020-08-06 DIAGNOSIS — K8020 Calculus of gallbladder without cholecystitis without obstruction: Secondary | ICD-10-CM | POA: Diagnosis not present

## 2020-08-06 DIAGNOSIS — E119 Type 2 diabetes mellitus without complications: Secondary | ICD-10-CM | POA: Diagnosis not present

## 2020-08-06 DIAGNOSIS — E041 Nontoxic single thyroid nodule: Secondary | ICD-10-CM | POA: Diagnosis not present

## 2020-08-12 ENCOUNTER — Ambulatory Visit: Payer: BC Managed Care – PPO | Admitting: Dietician

## 2020-08-26 ENCOUNTER — Other Ambulatory Visit: Admission: RE | Admit: 2020-08-26 | Payer: BC Managed Care – PPO | Source: Ambulatory Visit

## 2020-09-02 ENCOUNTER — Other Ambulatory Visit: Admission: RE | Admit: 2020-09-02 | Payer: BC Managed Care – PPO | Source: Ambulatory Visit

## 2020-09-04 ENCOUNTER — Encounter: Admission: RE | Payer: Self-pay | Source: Home / Self Care

## 2020-09-04 ENCOUNTER — Ambulatory Visit: Admission: RE | Admit: 2020-09-04 | Payer: BC Managed Care – PPO | Source: Home / Self Care | Admitting: Surgery

## 2020-09-04 SURGERY — CHOLECYSTECTOMY, ROBOT-ASSISTED, LAPAROSCOPIC
Anesthesia: General | Site: Abdomen

## 2020-10-22 DIAGNOSIS — E119 Type 2 diabetes mellitus without complications: Secondary | ICD-10-CM | POA: Diagnosis not present

## 2020-10-22 DIAGNOSIS — E559 Vitamin D deficiency, unspecified: Secondary | ICD-10-CM | POA: Diagnosis not present

## 2020-10-22 DIAGNOSIS — Z114 Encounter for screening for human immunodeficiency virus [HIV]: Secondary | ICD-10-CM | POA: Diagnosis not present

## 2020-10-22 DIAGNOSIS — Z1159 Encounter for screening for other viral diseases: Secondary | ICD-10-CM | POA: Diagnosis not present

## 2020-10-23 ENCOUNTER — Other Ambulatory Visit: Payer: Self-pay | Admitting: Internal Medicine

## 2020-10-23 DIAGNOSIS — D219 Benign neoplasm of connective and other soft tissue, unspecified: Secondary | ICD-10-CM | POA: Diagnosis not present

## 2020-10-23 DIAGNOSIS — Z1231 Encounter for screening mammogram for malignant neoplasm of breast: Secondary | ICD-10-CM

## 2020-10-23 DIAGNOSIS — Z0000 Encounter for general adult medical examination without abnormal findings: Secondary | ICD-10-CM | POA: Diagnosis not present

## 2020-10-23 DIAGNOSIS — Z1211 Encounter for screening for malignant neoplasm of colon: Secondary | ICD-10-CM | POA: Diagnosis not present

## 2020-10-23 DIAGNOSIS — E119 Type 2 diabetes mellitus without complications: Secondary | ICD-10-CM | POA: Diagnosis not present

## 2020-11-13 DIAGNOSIS — E559 Vitamin D deficiency, unspecified: Secondary | ICD-10-CM | POA: Diagnosis not present

## 2020-11-13 DIAGNOSIS — H938X3 Other specified disorders of ear, bilateral: Secondary | ICD-10-CM | POA: Diagnosis not present

## 2020-11-13 DIAGNOSIS — E119 Type 2 diabetes mellitus without complications: Secondary | ICD-10-CM | POA: Diagnosis not present

## 2020-11-15 DIAGNOSIS — E119 Type 2 diabetes mellitus without complications: Secondary | ICD-10-CM | POA: Diagnosis not present

## 2020-11-20 ENCOUNTER — Other Ambulatory Visit: Payer: Self-pay | Admitting: Obstetrics and Gynecology

## 2020-11-20 DIAGNOSIS — Z113 Encounter for screening for infections with a predominantly sexual mode of transmission: Secondary | ICD-10-CM | POA: Diagnosis not present

## 2020-11-20 DIAGNOSIS — N760 Acute vaginitis: Secondary | ICD-10-CM | POA: Diagnosis not present

## 2020-11-20 DIAGNOSIS — Z01419 Encounter for gynecological examination (general) (routine) without abnormal findings: Secondary | ICD-10-CM | POA: Diagnosis not present

## 2020-11-20 DIAGNOSIS — Z1231 Encounter for screening mammogram for malignant neoplasm of breast: Secondary | ICD-10-CM

## 2020-11-20 DIAGNOSIS — N898 Other specified noninflammatory disorders of vagina: Secondary | ICD-10-CM | POA: Diagnosis not present

## 2020-11-20 DIAGNOSIS — Z1331 Encounter for screening for depression: Secondary | ICD-10-CM | POA: Diagnosis not present

## 2020-11-23 DIAGNOSIS — E1165 Type 2 diabetes mellitus with hyperglycemia: Secondary | ICD-10-CM | POA: Diagnosis not present

## 2020-11-27 DIAGNOSIS — E1065 Type 1 diabetes mellitus with hyperglycemia: Secondary | ICD-10-CM | POA: Diagnosis not present

## 2020-11-27 DIAGNOSIS — E782 Mixed hyperlipidemia: Secondary | ICD-10-CM | POA: Diagnosis not present

## 2020-11-27 DIAGNOSIS — E1069 Type 1 diabetes mellitus with other specified complication: Secondary | ICD-10-CM | POA: Diagnosis not present

## 2020-12-03 DIAGNOSIS — Z1211 Encounter for screening for malignant neoplasm of colon: Secondary | ICD-10-CM | POA: Diagnosis not present

## 2020-12-04 DIAGNOSIS — Z7184 Encounter for health counseling related to travel: Secondary | ICD-10-CM | POA: Diagnosis not present

## 2020-12-17 ENCOUNTER — Ambulatory Visit
Admission: RE | Admit: 2020-12-17 | Discharge: 2020-12-17 | Disposition: A | Discharge status: Home / Self Care | Payer: BC Managed Care – PPO | Source: Ambulatory Visit | Attending: Obstetrics and Gynecology | Admitting: Obstetrics and Gynecology

## 2020-12-17 ENCOUNTER — Other Ambulatory Visit: Payer: Self-pay

## 2020-12-17 DIAGNOSIS — Z1231 Encounter for screening mammogram for malignant neoplasm of breast: Secondary | ICD-10-CM | POA: Insufficient documentation

## 2020-12-18 ENCOUNTER — Inpatient Hospital Stay
Admission: RE | Admit: 2020-12-18 | Discharge: 2020-12-18 | Disposition: A | Discharge status: Home / Self Care | Payer: Self-pay | Source: Ambulatory Visit | Attending: *Deleted | Admitting: *Deleted

## 2020-12-18 ENCOUNTER — Other Ambulatory Visit: Payer: Self-pay | Admitting: *Deleted

## 2020-12-18 DIAGNOSIS — Z1231 Encounter for screening mammogram for malignant neoplasm of breast: Secondary | ICD-10-CM

## 2020-12-21 DIAGNOSIS — E1165 Type 2 diabetes mellitus with hyperglycemia: Secondary | ICD-10-CM | POA: Diagnosis not present

## 2020-12-24 DIAGNOSIS — Z01818 Encounter for other preprocedural examination: Secondary | ICD-10-CM | POA: Diagnosis not present

## 2020-12-24 DIAGNOSIS — Z1211 Encounter for screening for malignant neoplasm of colon: Secondary | ICD-10-CM | POA: Diagnosis not present

## 2021-01-03 DIAGNOSIS — E119 Type 2 diabetes mellitus without complications: Secondary | ICD-10-CM | POA: Diagnosis not present

## 2021-01-03 DIAGNOSIS — B351 Tinea unguium: Secondary | ICD-10-CM | POA: Diagnosis not present

## 2021-03-17 ENCOUNTER — Ambulatory Visit: Admit: 2021-03-17 | Payer: BC Managed Care – PPO

## 2021-03-17 SURGERY — COLONOSCOPY WITH PROPOFOL
Anesthesia: General

## 2021-03-26 DIAGNOSIS — E1065 Type 1 diabetes mellitus with hyperglycemia: Secondary | ICD-10-CM | POA: Diagnosis not present

## 2021-03-26 DIAGNOSIS — Z113 Encounter for screening for infections with a predominantly sexual mode of transmission: Secondary | ICD-10-CM | POA: Diagnosis not present

## 2021-03-26 DIAGNOSIS — Z114 Encounter for screening for human immunodeficiency virus [HIV]: Secondary | ICD-10-CM | POA: Diagnosis not present

## 2021-03-26 DIAGNOSIS — E559 Vitamin D deficiency, unspecified: Secondary | ICD-10-CM | POA: Diagnosis not present

## 2021-03-26 DIAGNOSIS — D234 Other benign neoplasm of skin of scalp and neck: Secondary | ICD-10-CM | POA: Diagnosis not present

## 2021-03-26 DIAGNOSIS — E119 Type 2 diabetes mellitus without complications: Secondary | ICD-10-CM | POA: Diagnosis not present

## 2021-03-26 DIAGNOSIS — E1069 Type 1 diabetes mellitus with other specified complication: Secondary | ICD-10-CM | POA: Diagnosis not present

## 2021-03-26 DIAGNOSIS — E611 Iron deficiency: Secondary | ICD-10-CM | POA: Diagnosis not present

## 2021-04-17 DIAGNOSIS — E559 Vitamin D deficiency, unspecified: Secondary | ICD-10-CM | POA: Diagnosis not present

## 2021-04-17 DIAGNOSIS — F411 Generalized anxiety disorder: Secondary | ICD-10-CM | POA: Diagnosis not present

## 2021-04-17 DIAGNOSIS — E119 Type 2 diabetes mellitus without complications: Secondary | ICD-10-CM | POA: Diagnosis not present

## 2021-04-17 DIAGNOSIS — B351 Tinea unguium: Secondary | ICD-10-CM | POA: Diagnosis not present

## 2021-05-05 DIAGNOSIS — L7211 Pilar cyst: Secondary | ICD-10-CM | POA: Diagnosis not present

## 2021-05-30 DIAGNOSIS — S76111A Strain of right quadriceps muscle, fascia and tendon, initial encounter: Secondary | ICD-10-CM | POA: Diagnosis not present

## 2021-06-26 DIAGNOSIS — E782 Mixed hyperlipidemia: Secondary | ICD-10-CM | POA: Diagnosis not present

## 2021-06-26 DIAGNOSIS — E611 Iron deficiency: Secondary | ICD-10-CM | POA: Diagnosis not present

## 2021-06-26 DIAGNOSIS — E1069 Type 1 diabetes mellitus with other specified complication: Secondary | ICD-10-CM | POA: Diagnosis not present

## 2021-06-26 DIAGNOSIS — E559 Vitamin D deficiency, unspecified: Secondary | ICD-10-CM | POA: Diagnosis not present

## 2021-06-26 DIAGNOSIS — E1065 Type 1 diabetes mellitus with hyperglycemia: Secondary | ICD-10-CM | POA: Diagnosis not present

## 2021-07-03 DIAGNOSIS — E1065 Type 1 diabetes mellitus with hyperglycemia: Secondary | ICD-10-CM | POA: Diagnosis not present

## 2021-07-03 DIAGNOSIS — E1069 Type 1 diabetes mellitus with other specified complication: Secondary | ICD-10-CM | POA: Diagnosis not present

## 2021-07-03 DIAGNOSIS — E559 Vitamin D deficiency, unspecified: Secondary | ICD-10-CM | POA: Diagnosis not present

## 2021-07-03 DIAGNOSIS — E1169 Type 2 diabetes mellitus with other specified complication: Secondary | ICD-10-CM | POA: Diagnosis not present

## 2021-07-09 DIAGNOSIS — E119 Type 2 diabetes mellitus without complications: Secondary | ICD-10-CM | POA: Diagnosis not present

## 2021-07-09 DIAGNOSIS — R079 Chest pain, unspecified: Secondary | ICD-10-CM | POA: Diagnosis not present

## 2021-07-09 DIAGNOSIS — F411 Generalized anxiety disorder: Secondary | ICD-10-CM | POA: Diagnosis not present

## 2021-07-09 DIAGNOSIS — Z23 Encounter for immunization: Secondary | ICD-10-CM | POA: Diagnosis not present

## 2021-07-09 DIAGNOSIS — D234 Other benign neoplasm of skin of scalp and neck: Secondary | ICD-10-CM | POA: Diagnosis not present

## 2021-08-23 ENCOUNTER — Emergency Department: Payer: BC Managed Care – PPO

## 2021-08-23 ENCOUNTER — Other Ambulatory Visit: Payer: Self-pay

## 2021-08-23 ENCOUNTER — Encounter: Payer: Self-pay | Admitting: Emergency Medicine

## 2021-08-23 ENCOUNTER — Emergency Department
Admission: EM | Admit: 2021-08-23 | Discharge: 2021-08-23 | Disposition: A | Discharge status: Home / Self Care | Payer: BC Managed Care – PPO | Attending: Emergency Medicine | Admitting: Emergency Medicine

## 2021-08-23 DIAGNOSIS — E119 Type 2 diabetes mellitus without complications: Secondary | ICD-10-CM | POA: Insufficient documentation

## 2021-08-23 DIAGNOSIS — Z79899 Other long term (current) drug therapy: Secondary | ICD-10-CM | POA: Diagnosis not present

## 2021-08-23 DIAGNOSIS — I10 Essential (primary) hypertension: Secondary | ICD-10-CM | POA: Insufficient documentation

## 2021-08-23 DIAGNOSIS — F17210 Nicotine dependence, cigarettes, uncomplicated: Secondary | ICD-10-CM | POA: Insufficient documentation

## 2021-08-23 DIAGNOSIS — Z20822 Contact with and (suspected) exposure to covid-19: Secondary | ICD-10-CM | POA: Diagnosis not present

## 2021-08-23 DIAGNOSIS — Z7982 Long term (current) use of aspirin: Secondary | ICD-10-CM | POA: Diagnosis not present

## 2021-08-23 DIAGNOSIS — R0789 Other chest pain: Secondary | ICD-10-CM | POA: Diagnosis not present

## 2021-08-23 DIAGNOSIS — R079 Chest pain, unspecified: Secondary | ICD-10-CM | POA: Insufficient documentation

## 2021-08-23 LAB — BASIC METABOLIC PANEL
Anion gap: 5 (ref 5–15)
BUN: 10 mg/dL (ref 6–20)
CO2: 30 mmol/L (ref 22–32)
Calcium: 8.8 mg/dL — ABNORMAL LOW (ref 8.9–10.3)
Chloride: 105 mmol/L (ref 98–111)
Creatinine, Ser: 0.62 mg/dL (ref 0.44–1.00)
GFR, Estimated: >60 mL/min (ref >60)
Glucose, Bld: 159 mg/dL — ABNORMAL HIGH (ref 70–99)
Potassium: 3.8 mmol/L (ref 3.5–5.1)
Sodium: 140 mmol/L (ref 135–145)

## 2021-08-23 LAB — CBC
HCT: 37.5 % (ref 36.0–46.0)
Hemoglobin: 12.6 g/dL (ref 12.0–15.0)
MCH: 27.3 pg (ref 26.0–34.0)
MCHC: 33.6 g/dL (ref 30.0–36.0)
MCV: 81.2 fL (ref 80.0–100.0)
Platelets: 153 10*3/uL (ref 150–400)
RBC: 4.62 MIL/uL (ref 3.87–5.11)
RDW: 13.8 % (ref 11.5–15.5)
WBC: 4.2 10*3/uL (ref 4.0–10.5)
nRBC: 0 % (ref 0.0–0.2)

## 2021-08-23 LAB — CK: Total CK: 93 U/L (ref 38–234)

## 2021-08-23 LAB — RESP PANEL BY RT-PCR (FLU A&B, COVID) ARPGX2
Influenza A by PCR: NEGATIVE
Influenza B by PCR: NEGATIVE
SARS Coronavirus 2 by RT PCR: NEGATIVE

## 2021-08-23 LAB — TROPONIN I (HIGH SENSITIVITY)
Troponin I (High Sensitivity): <2 ng/L (ref <18)
Troponin I (High Sensitivity): <2 ng/L (ref <18)

## 2021-08-23 NOTE — ED Triage Notes (Signed)
Pt reports intermittent CP that is sharp and shooting in nature for the last 3-4 weeks that causes pain in her left are as well. Pt denies SOB, cough, congestion or other sx's.

## 2021-08-23 NOTE — Discharge Instructions (Addendum)
Take Tylenol 1 g every 8 hours to help with any discomfort.  Follow-up for your stress test.  If the chest pain is changing and getting worse return to the ER for repeat evaluation

## 2021-08-23 NOTE — ED Provider Notes (Addendum)
[facility] Emergency Department Provider Note  ____________________________________________   Event Date/Time   First MD Initiated Contact with Patient 08/23/21 1405     (approximate)  I have reviewed the triage vital signs and the nursing notes.   HISTORY  Chief Complaint Chest Pain    HPI Brianna Stephens is a 50 y.o. female with diabetes, hyperlipidemia who comes in with concerns for chest pain.  Patient reports intermittent chest pain for over 1 month.  She is ports that the pain is a constant dull ache, mild but then she will have these intermittent periods of this pain more severe pain.  She reports episode happened this morning where it radiated up into her neck.  Nothing made it better or worse.  She denies these being exertional in nature.  She reports coming in today because she had an episode this morning that was more severe than the prior ones and it radiated up into her neck.  She denies any significant pain at this time.  She denies any swelling in 1 leg.  Denies any history of blood clots.   She is already been seen by her primary care doctor for this and has a stress test planned for Tuesday.  She denies any significant shortness of breath, leg swelling or any other concerns.          Past Medical History:  Diagnosis Date   Diabetes mellitus without complication ([place])    Hyperlipidemia     Patient Active Problem List   Diagnosis Date Noted   Multinodular goiter 03/01/2020   Hair loss 02/27/2020   Poor short term memory 10/05/2019   Type 2 diabetes mellitus without complication, without long-term current use of insulin ([place]) 10/05/2019   Exposure to COVID-19 virus 10/05/2019    Past Surgical History:  Procedure Laterality Date   bone spur surgery     both pinky toes   BREAST REDUCTION SURGERY Bilateral    CESAREAN SECTION     x 2   REDUCTION MAMMAPLASTY Bilateral    and lift     Prior to Admission medications    Medication Sig Start Date End Date Taking? Authorizing Provider  Accu-Chek FastClix Lancets MISC 1 each by Other route daily. E11.9 11/07/18   [provider]  ALPRAZolam Duanne Moron) 0.5 MG tablet Take 0.5 mg by mouth 2 (two) times daily as needed for anxiety.  10/19/18   [provider]  aspirin-acetaminophen-caffeine (EXCEDRIN MIGRAINE) [phone] MG tablet Take 1 tablet by mouth every 6 (six) hours as needed for headache. Patient not taking: Reported on 07/29/2020 07/04/20   Cuthriell, Charline Bills, PA-C  atorvastatin (LIPITOR) 20 MG tablet Take 20 mg by mouth every other day.  09/03/19   [provider]  Continuous Blood Gluc Sensor (FREESTYLE LIBRE 14 DAY SENSOR) MISC 1 Device by Does not apply route every 14 (fourteen) days. 05/01/20   Renato Shin, MD  dibucaine (NUPERCAINAL) 1 % OINT Place 1 application rectally as needed for hemorrhoids. Patient not taking: Reported on 07/29/2020 07/21/20   Lannie Fields, PA-C  fluticasone [facility]) 50 MCG/ACT nasal spray Place 1 spray into both nostrils as needed.  Patient not taking: Reported on 07/29/2020 06/12/19 06/11/20  [provider]  glucose blood (ACCU-CHEK GUIDE) test strip 1 each by Other route daily. And lancets 1/day 05/14/20   Renato Shin, MD  Incontinent Wash [place]) LOTN Apply 1 application topically daily. 07/21/20   Vallarie Mare M, PA-C  JANUVIA 100 MG tablet  Take 100 mg by mouth daily. 07/05/20   [provider]  LINZESS 145 MCG CAPS capsule Take 145 mcg by mouth daily as needed. Patient not taking: Reported on 07/29/2020 06/05/19   [provider]  Multiple Vitamin (MULTIVITAMIN) tablet Take 1 tablet by mouth at bedtime.     [provider]  Semaglutide, 1 MG/DOSE, (OZEMPIC, 1 MG/DOSE,) 4 MG/3ML SOPN Inject 0.75 mLs (1 mg total) into the skin once a week. Patient not taking: Reported on 07/29/2020 05/01/20   Renato Shin, MD    Allergies Patient has no known  allergies.  Family History  Problem Relation Age of Onset   Diabetes Mother    Hypertension Mother    Hypertension Father    Asthma Sister    Diabetes Paternal Grandmother    Atrial fibrillation Paternal Grandmother     Social History Social History   Tobacco Use   Smoking status: Some Days    Packs/day: 0.25    Years: 22.00    Pack years: 5.50    Types: Cigarettes   Smokeless tobacco: Never   Tobacco comments:    not daily  Vaping Use   Vaping Use: Never used  Substance Use Topics   Alcohol use: Yes    Comment: wine- 2 days a week   Drug use: Never      Review of Systems Constitutional: No fever/chills Eyes: No visual changes. ENT: No sore throat. Cardiovascular: Positive chest pain Respiratory: Denies shortness of breath. Gastrointestinal: No abdominal pain.  No nausea, no vomiting.  No diarrhea.  No constipation. Genitourinary: Negative for dysuria. Musculoskeletal: Negative for back pain. Skin: Negative for rash. Neurological: Negative for headaches, focal weakness or numbness. All other ROS negative ____________________________________________   PHYSICAL EXAM:  VITAL SIGNS: ED Triage Vitals  Enc Vitals Group     BP 08/23/21 1214 (!) 136/96     Pulse Rate 08/23/21 1214 79     Resp 08/23/21 1214 18     Temp 08/23/21 1214 97.7 F (36.5 C)     Temp Source 08/23/21 1214 Oral     SpO2 08/23/21 1214 100 %     Weight 08/23/21 1154 163 lb (73.9 kg)     Height 08/23/21 1154 5\' 6"  (1.676 m)     Head Circumference --      Peak Flow --      Pain Score 08/23/21 1154 5     Pain Loc --      Pain Edu? --      Excl. in [employer]? --     Constitutional: Alert and oriented. Well appearing and in no acute distress. Eyes: Conjunctivae are normal. EOMI. Head: Atraumatic. Nose: No congestion/rhinnorhea. Mouth/Throat: Mucous membranes are moist.   Neck: No stridor. Trachea Midline. FROM Cardiovascular: Normal rate, regular rhythm. Grossly normal heart sounds.  Good  peripheral circulation. Respiratory: Normal respiratory effort.  No retractions. Lungs CTAB. Gastrointestinal: Soft and nontender. No distention. No abdominal bruits.  Musculoskeletal: No lower extremity tenderness nor edema.  No joint effusions. Neurologic:  Normal speech and language. No gross focal neurologic deficits are appreciated.  Skin:  Skin is warm, dry and intact. No rash noted. Psychiatric: Mood and affect are normal. Speech and behavior are normal. GU: Deferred   ____________________________________________   LABS (all labs ordered are listed, but only abnormal results are displayed)  Labs Reviewed  BASIC METABOLIC PANEL - Abnormal; Notable for the following components:      Result Value   Glucose, Bld 159 (*)  Calcium 8.8 (*)    All other components within normal limits  RESP PANEL BY RT-PCR (FLU A&B, COVID) ARPGX2  CBC  POC URINE PREG, ED  TROPONIN I (HIGH SENSITIVITY)  TROPONIN I (HIGH SENSITIVITY)   ____________________________________________   ED ECG REPORT I, Vanessa [place], the attending physician, personally viewed and interpreted this ECG.  Normal sinus rate of 73, no ST elevation, no T wave versions, normal intervals ____________________________________________  RADIOLOGY Robert Bellow, personally viewed and evaluated these images (plain radiographs) as part of my medical decision making, as well as reviewing the written report by the radiologist.  ED MD interpretation: No pneumonia  Official radiology report(s): DG Chest 2 View  Result Date: 08/23/2021 CLINICAL DATA:  Chest pain EXAM: CHEST - 2 VIEW COMPARISON:  Chest x-ray 02/04/2020 FINDINGS: Heart size and mediastinal contours are within normal limits. No suspicious pulmonary opacities identified. No pleural effusion or pneumothorax visualized. No acute osseous abnormality appreciated. IMPRESSION: No acute intrathoracic process identified. Electronically Signed   By: Ofilia Neas M.D.    On: 08/23/2021 12:34    ____________________________________________   PROCEDURES  Procedure(s) performed (including Critical Care):  Procedures   ____________________________________________   INITIAL IMPRESSION / ASSESSMENT AND PLAN / ED COURSE   Adana Pedro was evaluated in Emergency Department on 08/23/2021 for the symptoms described in the history of present illness. She was evaluated in the context of the global COVID-19 pandemic, which necessitated consideration that the patient might be at risk for infection with the SARS-CoV-2 virus that causes COVID-19. Institutional protocols and algorithms that pertain to the evaluation of patients at risk for COVID-19 are in a state of rapid change based on information released by regulatory bodies including the CDC and federal and state organizations. These policies and algorithms were followed during the patient's care in the ED.    Most Likely DDx:  -MSK (atypical chest pain) but will get cardiac markers to evaluate for ACS given risk factors/age -COVID swab ordered from triage due to the patient reporting some viral symptoms as well -No rash noted on exam   DDx that was also considered d/t potential to cause harm, but was found less likely based on history and physical (as detailed above): -PNA (no fevers, cough but CXR to evaluate) -PNX (reassured with equal b/l breath sounds, CXR to evaluate) -Symptomatic anemia (will get H&H) -Pulmonary embolism as no sob at rest, not pleuritic in nature, no hypoxia -Aortic Dissection as no tearing pain and no radiation to the mid back, pulses equal -Pericarditis no rub on exam, EKG changes or hx to suggest dx -Tamponade (no notable SOB, tachycardic, hypotensive) -Esophageal rupture (no h/o diffuse vomitting/no crepitus)  COVID, flu test are negative.  Labs are reassuring.  White count is normal.  No evidence of anemia.  Cardiac marker is negative Chest x-ray is without evidence of  pneumonia  If repeat troponin is negative will discharge home and patient already has stress test plan for outpatient.  She feels comfortable with this plan and will return if she develops worsening or changing chest pain  Patient is insisting to leave prior to her CK level coming back.  She will follow-up in MyChart      ____________________________________________   FINAL CLINICAL IMPRESSION(S) / ED DIAGNOSES   Final diagnoses:  Chest pain, unspecified type     MEDICATIONS GIVEN DURING THIS VISIT:  Medications - No data to display   ED Discharge Orders     None  Note:  This document was prepared using Dragon voice recognition software and may include unintentional dictation errors.    Concha Se, MD 08/23/21 1528    Concha Se, MD 08/23/21 [phone]

## 2021-08-23 NOTE — ED Provider Notes (Signed)
°  Emergency Medicine Provider Triage Evaluation Note  Brianna Stephens , a 50 y.o.female,  was evaluated in triage.  Pt complains of chest pain.  Patient states that she has been having ongoing chest pain that radiates to her left shoulder for the past 4 weeks.  She is concerned because the past 2 nights she has also had hot flashes/chills, as well as night sweats.  Additionally endorsing a recent cough/sinus congestion.  Denies shortness of breath, abdominal pain, back pain, or urinary symptoms.   Review of Systems  Positive: Chest pain, cough, sinus congestion. Negative: Denies fever, abdominal pain, vomiting  Physical Exam   Vitals:   08/23/21 1214  BP: (!) 136/96  Pulse: 79  Resp: 18  Temp: 97.7 F (36.5 C)  SpO2: 100%   Gen:   Awake, no distress   Resp:  Normal effort  MSK:   Moves extremities without difficulty  Other:    Medical Decision Making  Given the patient's initial medical screening exam, the following diagnostic evaluation has been ordered. The patient will be placed in the appropriate treatment space, once one is available, to complete the evaluation and treatment. I have discussed the plan of care with the patient and I have advised the patient that an ED physician or mid-level practitioner will reevaluate their condition after the test results have been received, as the results may give them additional insight into the type of treatment they may need.    Diagnostics: Labs, EKG, CXR, respiratory panel.  Treatments: none immediately   Varney Daily, [place] 08/23/21 1217    Concha Se, MD 08/23/21 1335

## 2021-11-12 ENCOUNTER — Other Ambulatory Visit: Payer: Self-pay | Admitting: Internal Medicine

## 2021-11-12 DIAGNOSIS — K8020 Calculus of gallbladder without cholecystitis without obstruction: Secondary | ICD-10-CM

## 2021-11-12 DIAGNOSIS — R1011 Right upper quadrant pain: Secondary | ICD-10-CM

## 2021-11-19 ENCOUNTER — Ambulatory Visit: Payer: BC Managed Care – PPO

## 2021-11-25 ENCOUNTER — Other Ambulatory Visit: Payer: Self-pay | Admitting: Obstetrics and Gynecology

## 2021-11-25 DIAGNOSIS — Z1231 Encounter for screening mammogram for malignant neoplasm of breast: Secondary | ICD-10-CM

## 2022-01-01 ENCOUNTER — Ambulatory Visit
Admission: RE | Admit: 2022-01-01 | Discharge: 2022-01-01 | Disposition: A | Discharge status: Home / Self Care | Payer: BC Managed Care – PPO | Source: Ambulatory Visit | Attending: Obstetrics and Gynecology | Admitting: Obstetrics and Gynecology

## 2022-01-01 DIAGNOSIS — Z1231 Encounter for screening mammogram for malignant neoplasm of breast: Secondary | ICD-10-CM | POA: Diagnosis present

## 2022-02-24 IMAGING — CT CT ANGIO HEAD
3 of 10 series · 17 of 47 positions shown · IV contrast (APPLIED)
Comparison: None.

CLINICAL DATA: Dizziness. New daily headache. Short-term memory
loss.

EXAM:
CT ANGIOGRAPHY HEAD
TECHNIQUE: Multidetector CT imaging of the head was performed using the
standard protocol during bolus administration of intravenous
contrast. Multiplanar CT image reconstructions and MIPs were
obtained to evaluate the vascular anatomy.
CONTRAST:  75mL OMNIPAQUE IOHEXOL 350 MG/ML SOLN

[Series 10: ax thin · axial · 0.34mm/px · z∈[-124,+6]mm · 11 of 152 slices shown]
[im 11/152  brain]
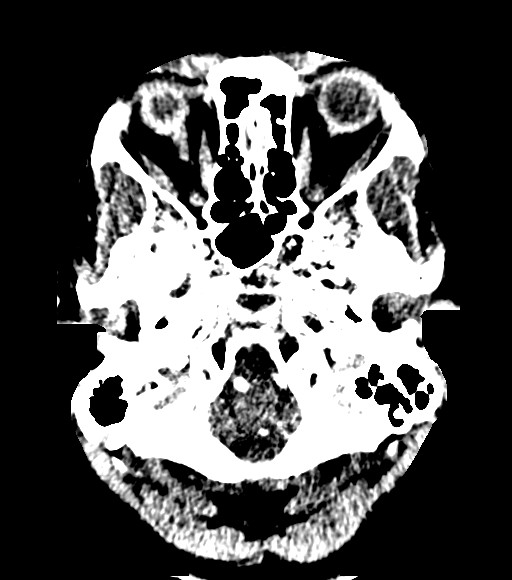
[im 21/152  bone]
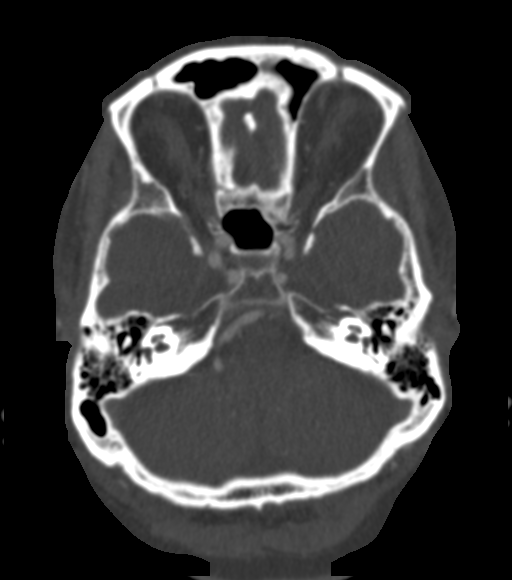
[im 41/152  brain]
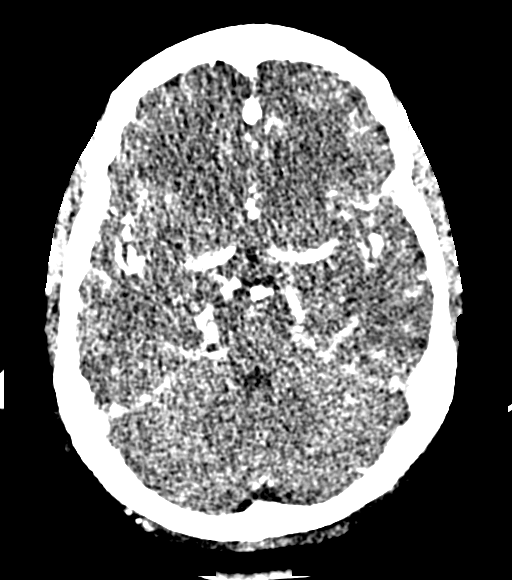
[im 51/152  bone]
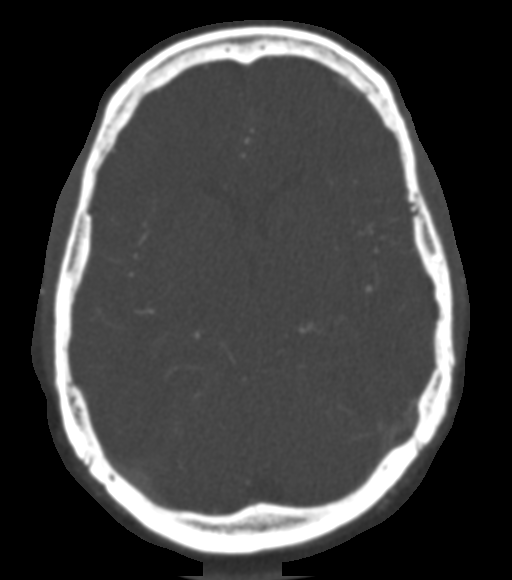
[im 61/152  brain]
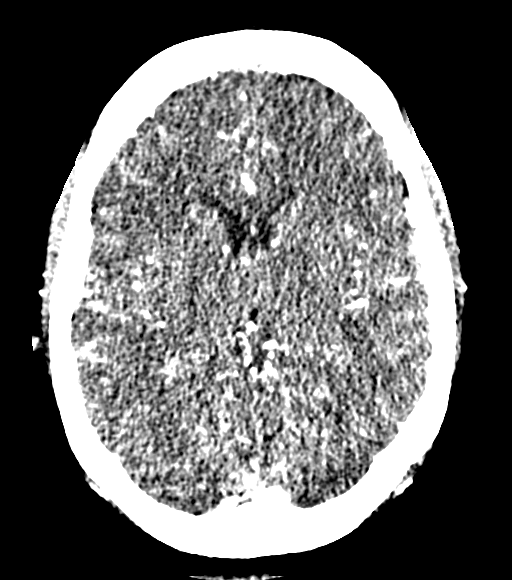
[im 81/152  bone]
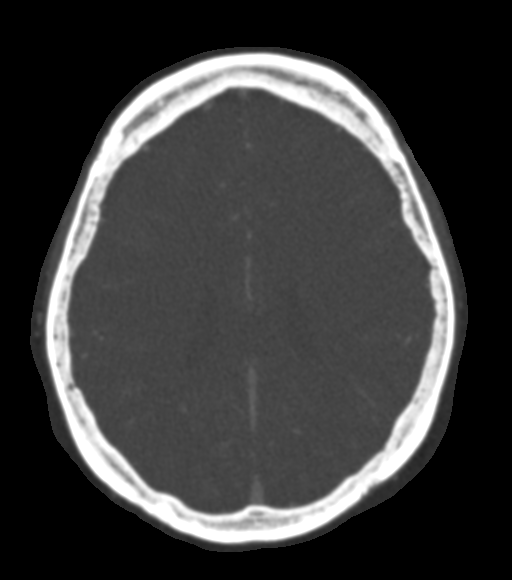
[im 91/152  brain]
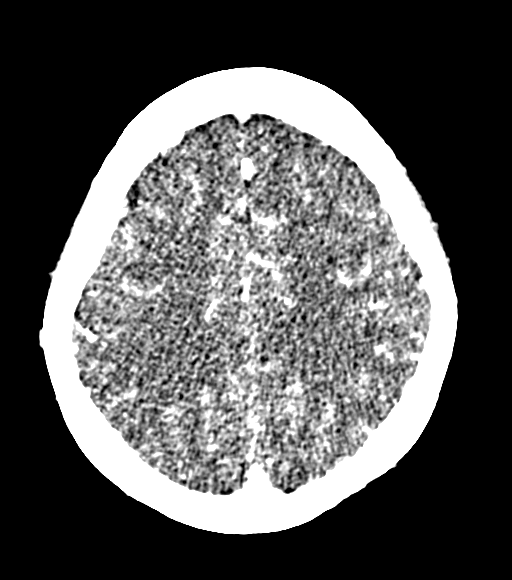
[im 101/152  bone]
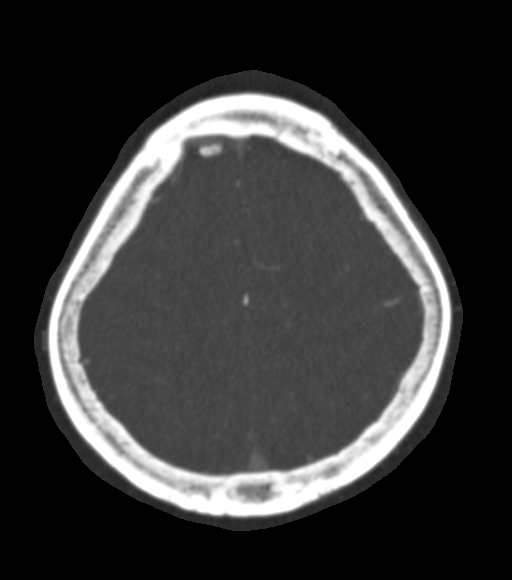
[im 111/152  brain]
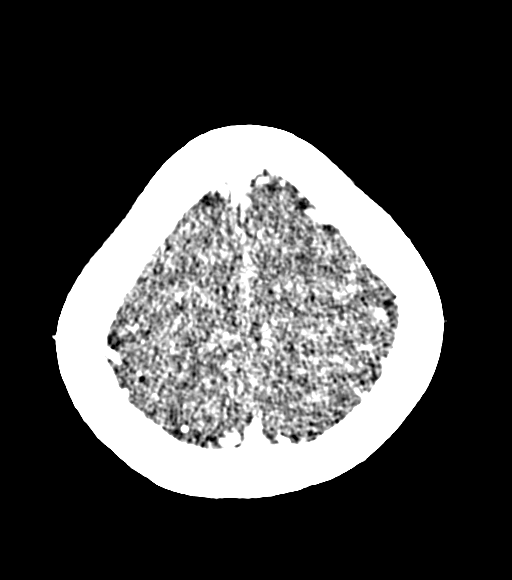
[im 131/152  bone]
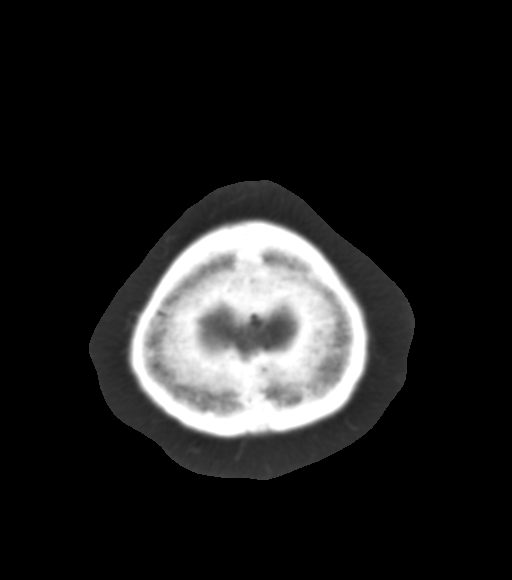
[im 141/152  brain]
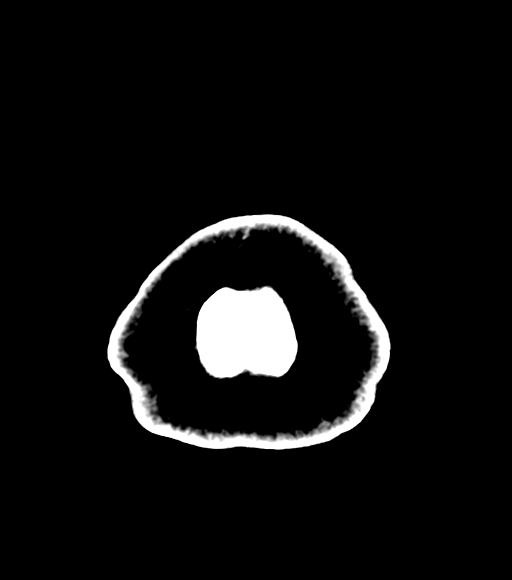

[Series 12: cor thin · coronal · 0.31mm/px · 3 of 186 slices shown]
[im 38/186  brain]
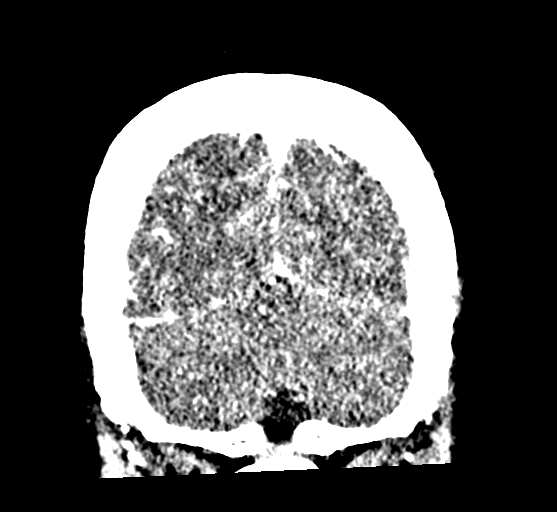
[im 75/186  brain]
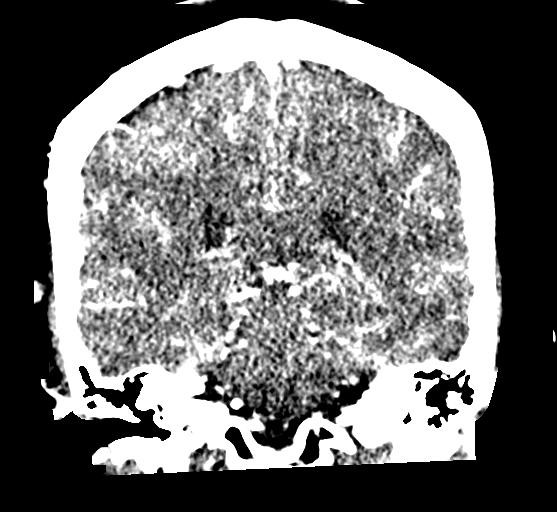
[im 112/186  brain]
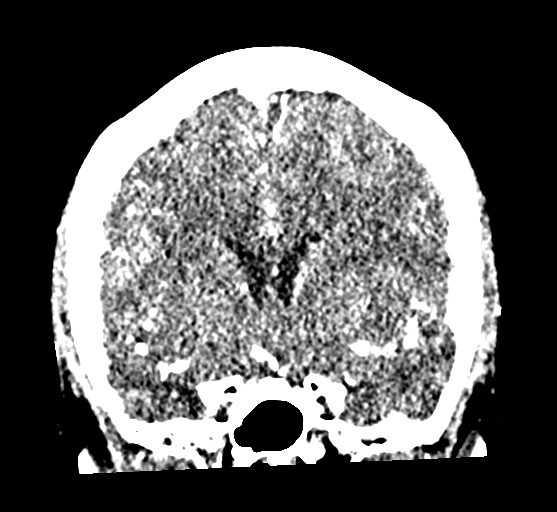

[Series 14: sag thin · sagittal · 0.31mm/px · 3 of 160 slices shown]
[im 40/160  brain]
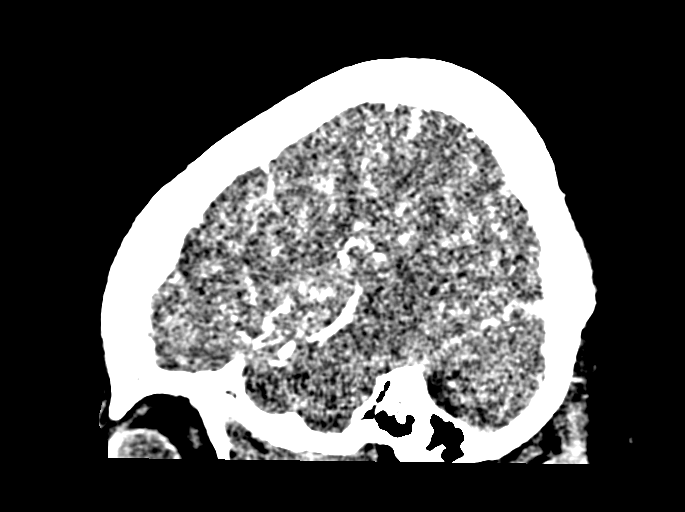
[im 80/160  brain]
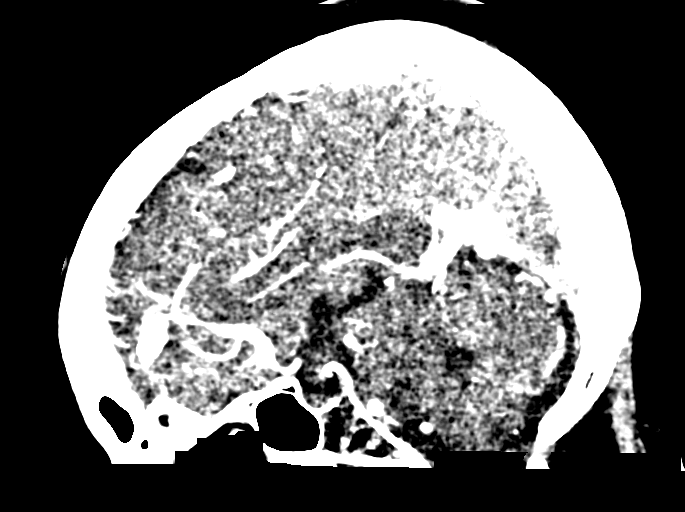
[im 120/160  brain]
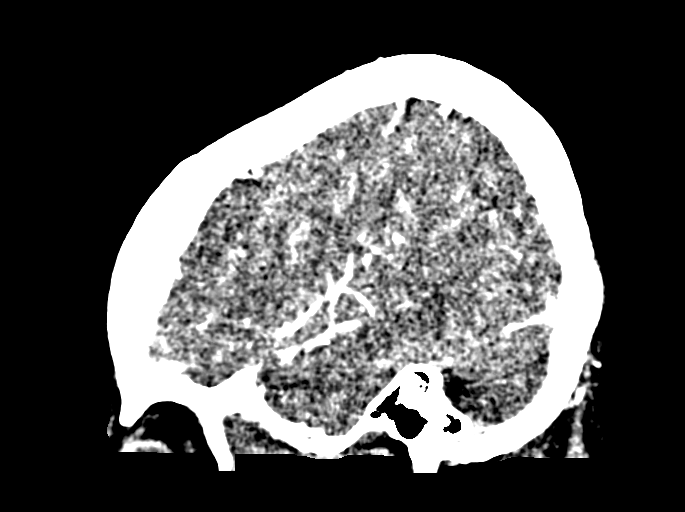

[17 of 47 positions shown; findings below may reference images not displayed]

FINDINGS: CT HEAD

Brain: The brain shows a normal appearance without evidence of
malformation, atrophy, old or acute small or large vessel
infarction, mass lesion, hemorrhage, hydrocephalus or extra-axial
collection.

Vascular: No hyperdense vessel. No evidence of atherosclerotic
calcification.

Skull: Normal.  No traumatic finding.  No focal bone lesion.

Sinuses/Orbits: Sinuses are clear. Orbits appear normal. Mastoids
are clear.

Other: None significant

CTA HEAD

Anterior circulation: Both internal carotid arteries are widely
patent through the skull base and siphon regions. No siphon
stenosis. The anterior and middle cerebral vessels are normal
without proximal stenosis, aneurysm or vascular malformation. No
vessel occlusion.

Posterior circulation: Both vertebral arteries widely patent through
the foramen magnum to the basilar. No basilar stenosis. Posterior
circulation branch vessels are normal.

Venous sinuses: Patent and normal.

Anatomic variants: None significant.
IMPRESSION: 1. Normal head CT.
2. Normal intracranial CT angiography. No large or medium vessel
occlusion, stenosis or aneurysm.

## 2022-10-08 ENCOUNTER — Other Ambulatory Visit: Payer: Self-pay | Admitting: Physician Assistant

## 2022-10-08 DIAGNOSIS — R202 Paresthesia of skin: Secondary | ICD-10-CM

## 2022-10-14 ENCOUNTER — Ambulatory Visit
Admission: RE | Admit: 2022-10-14 | Discharge: 2022-10-14 | Disposition: A | Discharge status: Home / Self Care | Payer: BC Managed Care – PPO | Source: Ambulatory Visit | Attending: Physician Assistant | Admitting: Physician Assistant

## 2022-10-14 DIAGNOSIS — R202 Paresthesia of skin: Secondary | ICD-10-CM | POA: Diagnosis present

## 2023-01-14 ENCOUNTER — Other Ambulatory Visit: Payer: Self-pay | Admitting: "Endocrinology

## 2023-01-14 DIAGNOSIS — E236 Other disorders of pituitary gland: Secondary | ICD-10-CM

## 2023-01-20 ENCOUNTER — Ambulatory Visit
Admission: RE | Admit: 2023-01-20 | Discharge: 2023-01-20 | Disposition: A | Discharge status: Home / Self Care | Payer: BC Managed Care – PPO | Source: Ambulatory Visit | Attending: "Endocrinology | Admitting: "Endocrinology

## 2023-01-20 DIAGNOSIS — E236 Other disorders of pituitary gland: Secondary | ICD-10-CM | POA: Diagnosis present

## 2023-01-20 MED ORDER — GADOBUTROL 1 MMOL/ML IV SOLN
7.0000 mL | Freq: Once | INTRAVENOUS | Status: AC | PRN
  Administered 2023-01-20: 7 mL via INTRAVENOUS

## 2023-10-04 ENCOUNTER — Other Ambulatory Visit: Payer: Self-pay | Admitting: Internal Medicine

## 2023-10-04 DIAGNOSIS — Z1231 Encounter for screening mammogram for malignant neoplasm of breast: Secondary | ICD-10-CM

## 2023-10-19 ENCOUNTER — Ambulatory Visit
Admission: RE | Admit: 2023-10-19 | Discharge: 2023-10-19 | Disposition: A | Discharge status: Home / Self Care | Payer: BC Managed Care – PPO | Source: Ambulatory Visit | Attending: Internal Medicine | Admitting: Internal Medicine

## 2023-10-19 DIAGNOSIS — Z1231 Encounter for screening mammogram for malignant neoplasm of breast: Secondary | ICD-10-CM | POA: Insufficient documentation

## 2023-10-26 ENCOUNTER — Other Ambulatory Visit: Payer: Self-pay | Admitting: Internal Medicine

## 2023-10-26 DIAGNOSIS — R928 Other abnormal and inconclusive findings on diagnostic imaging of breast: Secondary | ICD-10-CM

## 2023-10-27 ENCOUNTER — Other Ambulatory Visit: Payer: Self-pay | Admitting: Internal Medicine

## 2023-10-27 ENCOUNTER — Ambulatory Visit
Admission: RE | Admit: 2023-10-27 | Discharge: 2023-10-27 | Disposition: A | Discharge status: Home / Self Care | Payer: BC Managed Care – PPO | Source: Ambulatory Visit | Attending: Internal Medicine | Admitting: Internal Medicine

## 2023-10-27 DIAGNOSIS — R928 Other abnormal and inconclusive findings on diagnostic imaging of breast: Secondary | ICD-10-CM | POA: Insufficient documentation

## 2024-03-23 ENCOUNTER — Other Ambulatory Visit: Payer: Self-pay

## 2024-03-23 ENCOUNTER — Encounter: Payer: Self-pay | Admitting: Emergency Medicine

## 2024-03-23 ENCOUNTER — Emergency Department

## 2024-03-23 DIAGNOSIS — S8991XA Unspecified injury of right lower leg, initial encounter: Secondary | ICD-10-CM | POA: Insufficient documentation

## 2024-03-23 DIAGNOSIS — M25552 Pain in left hip: Secondary | ICD-10-CM | POA: Insufficient documentation

## 2024-03-23 DIAGNOSIS — Y92410 Unspecified street and highway as the place of occurrence of the external cause: Secondary | ICD-10-CM | POA: Diagnosis not present

## 2024-03-23 DIAGNOSIS — M25551 Pain in right hip: Secondary | ICD-10-CM | POA: Diagnosis not present
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
## 2024-03-24 ENCOUNTER — Emergency Department
Admission: EM | Admit: 2024-03-24 | Discharge: 2024-03-24 | Disposition: A | Discharge status: Home / Self Care | Attending: Emergency Medicine | Admitting: Emergency Medicine

## 2024-03-24 DIAGNOSIS — S8991XA Unspecified injury of right lower leg, initial encounter: Secondary | ICD-10-CM

## 2024-03-24 DIAGNOSIS — M25551 Pain in right hip: Secondary | ICD-10-CM

## 2024-03-24 MED ORDER — IBUPROFEN 600 MG PO TABS
600.0000 mg | ORAL_TABLET | Freq: Once | ORAL | Status: AC
  Administered 2024-03-24: 600 mg via ORAL
  Filled 2024-03-24: qty 1

## 2024-03-24 MED ORDER — LIDOCAINE 5 % EX PTCH: 1.0000 | MEDICATED_PATCH | CUTANEOUS | 0 refills | Status: AC

## 2024-03-24 MED ORDER — LIDOCAINE 5 % EX PTCH
2.0000 | MEDICATED_PATCH | CUTANEOUS | Status: DC
  Administered 2024-03-24: 2 via TRANSDERMAL
  Filled 2024-03-24: qty 2

## 2024-03-24 MED ORDER — ACETAMINOPHEN 500 MG PO TABS
1000.0000 mg | ORAL_TABLET | Freq: Once | ORAL | Status: AC
  Administered 2024-03-24: 1000 mg via ORAL
  Filled 2024-03-24: qty 2
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
## 2024-03-24 NOTE — Discharge Instructions (Signed)
 Take acetaminophen  650 mg and ibuprofen 400 mg every 6 hours for pain.  Take with food. Use lidoderm patches for pain as needed.   Thank you for choosing us  for your health care today!  Please see your primary doctor this week for a follow up appointment.   If you have any new, worsening, or unexpected symptoms call your doctor right away or come back to the emergency department for reevaluation.  It was my pleasure to care for you today.   Ginnie EDISON Cyrena, MD

## 2024-06-01 ENCOUNTER — Other Ambulatory Visit: Payer: Self-pay | Admitting: Sports Medicine

## 2024-06-01 DIAGNOSIS — G8929 Other chronic pain: Secondary | ICD-10-CM

## 2024-06-07 ENCOUNTER — Ambulatory Visit
Admission: RE | Admit: 2024-06-07 | Discharge: 2024-06-07 | Disposition: A | Discharge status: Home / Self Care | Source: Ambulatory Visit | Attending: Sports Medicine | Admitting: Sports Medicine

## 2024-06-07 DIAGNOSIS — G8929 Other chronic pain: Secondary | ICD-10-CM | POA: Insufficient documentation

## 2024-06-07 DIAGNOSIS — M25551 Pain in right hip: Secondary | ICD-10-CM | POA: Insufficient documentation

## 2024-06-07 DIAGNOSIS — M461 Sacroiliitis, not elsewhere classified: Secondary | ICD-10-CM | POA: Diagnosis not present

## 2024-09-21 ENCOUNTER — Other Ambulatory Visit: Payer: Self-pay | Admitting: Internal Medicine

## 2024-09-21 DIAGNOSIS — Z1231 Encounter for screening mammogram for malignant neoplasm of breast: Secondary | ICD-10-CM

## 2024-12-12 ENCOUNTER — Encounter
